# Patient Record
Sex: Male | Born: 1975 | Race: White | Hispanic: No | Marital: Married | Smoking: Former smoker
Health system: Southern US, Community
[De-identification: ages and names within clinical notes are randomized; demographics above are authoritative.]

## PROBLEM LIST (undated history)

## (undated) DIAGNOSIS — I471 Supraventricular tachycardia, unspecified: Secondary | ICD-10-CM

## (undated) DIAGNOSIS — J069 Acute upper respiratory infection, unspecified: Secondary | ICD-10-CM

## (undated) DIAGNOSIS — G4730 Sleep apnea, unspecified: Secondary | ICD-10-CM

## (undated) DIAGNOSIS — Z9852 Vasectomy status: Secondary | ICD-10-CM

## (undated) HISTORY — PX: COLONOSCOPY: SHX174

## (undated) HISTORY — DX: Acute upper respiratory infection, unspecified: J06.9

## (undated) HISTORY — DX: Supraventricular tachycardia, unspecified: I47.10

## (undated) HISTORY — DX: Supraventricular tachycardia: I47.1

## (undated) HISTORY — DX: Vasectomy status: Z98.52

## (undated) HISTORY — PX: CARDIAC CATHETERIZATION: SHX172

---

## 2014-06-05 LAB — BASIC METABOLIC PANEL
BUN: 13 mg/dL (ref 4–21)
CREATININE: 1 mg/dL (ref 0.6–1.3)
Glucose: 89 mg/dL

## 2014-06-05 LAB — CBC AND DIFFERENTIAL
HCT: 44 % (ref 41–53)
Hemoglobin: 15.1 g/dL (ref 13.5–17.5)
WBC: 6.4 10^3/mL

## 2014-06-05 LAB — LIPID PANEL
CHOLESTEROL: 175 mg/dL (ref 0–200)
HDL: 63 mg/dL (ref 35–70)
LDL CALC: 90 mg/dL
TRIGLYCERIDES: 112 mg/dL (ref 40–160)

## 2016-05-20 ENCOUNTER — Ambulatory Visit: Payer: Self-pay | Admitting: Osteopathic Medicine

## 2016-05-31 ENCOUNTER — Encounter: Payer: Self-pay | Admitting: Osteopathic Medicine

## 2016-05-31 ENCOUNTER — Ambulatory Visit (INDEPENDENT_AMBULATORY_CARE_PROVIDER_SITE_OTHER): Payer: 59 | Admitting: Osteopathic Medicine

## 2016-05-31 VITALS — BP 120/66 | HR 49 | Ht 70.0 in | Wt 292.0 lb

## 2016-05-31 DIAGNOSIS — Z8679 Personal history of other diseases of the circulatory system: Secondary | ICD-10-CM | POA: Insufficient documentation

## 2016-05-31 DIAGNOSIS — E291 Testicular hypofunction: Secondary | ICD-10-CM

## 2016-05-31 DIAGNOSIS — R5383 Other fatigue: Secondary | ICD-10-CM | POA: Diagnosis not present

## 2016-05-31 DIAGNOSIS — Z23 Encounter for immunization: Secondary | ICD-10-CM | POA: Diagnosis not present

## 2016-05-31 DIAGNOSIS — R001 Bradycardia, unspecified: Secondary | ICD-10-CM | POA: Diagnosis not present

## 2016-05-31 DIAGNOSIS — R635 Abnormal weight gain: Secondary | ICD-10-CM

## 2016-05-31 DIAGNOSIS — R6882 Decreased libido: Secondary | ICD-10-CM | POA: Insufficient documentation

## 2016-05-31 DIAGNOSIS — R7989 Other specified abnormal findings of blood chemistry: Secondary | ICD-10-CM

## 2016-05-31 NOTE — Patient Instructions (Signed)
Things to remember for exercise for weight loss: Gradually increasing the intensity, frequency, or duration of your workout. Increased interval training and muscle strengthening exercises will help overall fitness and weight loss as you build muscle mass and increase cardiovascular fitness. DO NOT use exercise as a pass to eat that Snickers! Weight is lost in the kitchen, the gym is just a bonus, which brings us to...  Things to remember for diet changes for weight loss: Calorie restriction with the goal weight loss of no more than one to one and a half pounds per week. Increase lean protein such as chicken, fish, [place]. Decrease fatty foods such as dairy, butter. Decrease sugary foods. Increase fiber found in fruits and vegetables and whole gtains. Avoid sugary drinks such as soda or juice. Eat small portions frequently rather than "three square meals per day." If you'd like a referral to a dietician to come up with a meal plan, I'm happy to order this for you - just let me know.   I encourage logging diet/exercise - this keeps you accountable #1 because I make you bring it to your next visit before prescribing medications ;) and #2 because it gives you an idea of your progress and tells us what you're doing well versus where you can improve. There are many apps available for your smartphone, but a notebook and a pen work just as well.   Medications approved for long-term use for obesity  Qsymia (Phentermine and Topiramate) * would avoid this one for you   Saxenda (Liraglutide 3 mg/day)  Contrave (Bupropion and Naltrexone)  Lorcaserin (Belviq or Belviq XR)  Orlistat (Xenical, Alli)  Bupropion (Wellbutrin) I recommend that you research the above medications to see what one might be a good fit for you, and then see which one(s) your insurance may or may not cover: If you call your insurance, ask them specifically "what medications are on my formulary for obesity treatment." They should be able to  send you a list or tell you over the phone. You can also ask if savings cards are an option for you.

## 2016-05-31 NOTE — Progress Notes (Signed)
HPI: Kyle Duncan is a 40 y.o. male  who presents to [facility] today, 05/31/16,  for chief complaint of:  Chief Complaint  Patient presents with  . Establish Care    New patient here to establish care. He has some concerns about weight gain, low libido, lack of energy over the past several months. Otherwise, no complaints.  Reports a history of paroxysmal supraventricular tachycardia, has never undergone ablation, was told that it was riskier than it was worse. Has a history of bradycardia, previously in the [employer]. No previous cardiac workup records available for review.    Past medical, surgical, social and family history reviewed: Past Medical History:  Diagnosis Date  . PSVT (paroxysmal supraventricular tachycardia) ([place])    History reviewed. No pertinent surgical history. Social History  Substance Use Topics  . Smoking status: Never Smoker  . Smokeless tobacco: Never Used  . Alcohol use Not on file   Family History  Problem Relation Age of Onset  . Cancer Father   . Diabetes Maternal Grandmother   . Hyperlipidemia Maternal Grandmother   . Heart attack Paternal Grandmother   . Stroke Paternal Grandfather      Current medication list and allergy/intolerance information reviewed:   No current outpatient prescriptions on file.   No current facility-administered medications for this visit.    No Known Allergies    Review of Systems:  Constitutional:  No  fever, no chills, No recent illness, No unintentional weight changes. No significant fatigue.   HEENT: No  headache, no vision change  Cardiac: No  chest pain, No  pressure, No palpitations, No  Orthopnea  Respiratory:  No  shortness of breath. No  Cough  Gastrointestinal: No  abdominal pain, No  nausea  Musculoskeletal: No new myalgia/arthralgia  Skin: No  Rash, No other wounds/concerning lesions  Hem/Onc: No  easy bruising/bleeding  Endocrine: No cold  intolerance,  No heat intolerance  Neurologic: No  weakness, No  dizziness, No  slurred speech/focal weakness/facial droop  Psychiatric: No  concerns with depression, No  concerns with anxiety, No sleep problems, No mood problems  Exam:  BP 120/66   Pulse (!) 49   Ht 5' 10"  (1.778 m)   Wt 292 lb (132.5 kg)   BMI 41.90 kg/m   Constitutional: VS see above. General Appearance: alert, well-developed, well-nourished, NAD  Eyes: Normal lids and conjunctive, non-icteric sclera  Ears, Nose, Mouth, Throat: MMM, Normal external inspection ears/nares/mouth/lips/gums.   Neck: No masses, trachea midline. No thyroid enlargement. No tenderness/mass appreciated. No lymphadenopathy  Respiratory: Normal respiratory effort. no wheeze, no rhonchi, no rales  Cardiovascular: S1/S2 normal, no murmur, no rub/gallop auscultated. Regular rhythm, rate 40s to 50s. No lower extremity edema.   Musculoskeletal: Gait normal. No clubbing/cyanosis of digits.   Neurological: Normal balance/coordination. No tremor.   Skin: warm, dry, intact. No rash/ulcer.   Psychiatric: Normal judgment/insight. Normal mood and affect. Oriented x3.    Results for orders placed or performed in visit on 05/31/16 (from the past 72 hour(s))  CBC with Differential/Platelet     Status: None   Collection Time: 05/31/16  8:20 AM  Result Value Ref Range   WBC 5.1 3.8 - 10.8 K/uL   RBC 4.44 4.20 - 5.80 MIL/uL   Hemoglobin 14.1 13.2 - 17.1 g/dL   HCT 41.1 38.5 - 50.0 %   MCV 92.6 80.0 - 100.0 fL   MCH 31.8 27.0 - 33.0 pg   MCHC 34.3 32.0 - 36.0  g/dL   RDW 13.0 11.0 - 15.0 %   Platelets 277 140 - 400 K/uL   MPV 10.1 7.5 - 12.5 fL   Neutro Abs 2,244 1,500 - 7,800 cells/uL   Lymphs Abs 2,142 850 - 3,900 cells/uL   Monocytes Absolute 510 200 - 950 cells/uL   Eosinophils Absolute 153 15 - 500 cells/uL   Basophils Absolute 51 0 - 200 cells/uL   Neutrophils Relative % 44 %   Lymphocytes Relative 42 %   Monocytes Relative 10 %    Eosinophils Relative 3 %   Basophils Relative 1 %   Smear Review Criteria for review not met   COMPLETE METABOLIC PANEL WITH GFR     Status: None   Collection Time: 05/31/16  8:20 AM  Result Value Ref Range   Sodium 138 135 - 146 mmol/L   Potassium 4.4 3.5 - 5.3 mmol/L   Chloride 102 98 - 110 mmol/L   CO2 29 20 - 31 mmol/L   Glucose, Bld 91 65 - 99 mg/dL   BUN 11 7 - 25 mg/dL   Creat 0.94 0.60 - 1.35 mg/dL   Total Bilirubin 0.8 0.2 - 1.2 mg/dL   Alkaline Phosphatase 51 40 - 115 U/L   AST 24 10 - 40 U/L   ALT 32 9 - 46 U/L   Total Protein 6.7 6.1 - 8.1 g/dL   Albumin 4.4 3.6 - 5.1 g/dL   Calcium 9.5 8.6 - 10.3 mg/dL   GFR, Est African American >89 >=60 mL/min   GFR, Est Non African American >89 >=60 mL/min  Lipid panel     Status: None   Collection Time: 05/31/16  8:20 AM  Result Value Ref Range   Cholesterol 183 125 - 200 mg/dL   Triglycerides 138 <150 mg/dL   HDL 66 >=40 mg/dL   Total CHOL/HDL Ratio 2.8 <=5.0 Ratio   VLDL 28 <30 mg/dL   LDL Cholesterol 89 <130 mg/dL    Comment:   Total Cholesterol/HDL Ratio:CHD Risk                        Coronary Heart Disease Risk Table                                        Men       Women          1/2 Average Risk              3.4        3.3              Average Risk              5.0        4.4           2X Average Risk              9.6        7.1           3X Average Risk             23.4       11.0 Use the calculated Patient Ratio above and the CHD Risk table  to determine the patient's CHD Risk.   TSH     Status: None   Collection Time: 05/31/16  8:20 AM  Result Value Ref Range  TSH 1.78 0.40 - 4.50 mIU/L  VITAMIN D 25 Hydroxy (Vit-D Deficiency, Fractures)     Status: None   Collection Time: 05/31/16  8:20 AM  Result Value Ref Range   Vit D, 25-Hydroxy 30 30 - 100 ng/mL    Comment: Vitamin D Status           25-OH Vitamin D        Deficiency                <20 ng/mL        Insufficiency         20 - 29 ng/mL         Optimal             > or = 30 ng/mL   For 25-OH Vitamin D testing on patients on D2-supplementation and patients for whom quantitation of D2 and D3 fractions is required, the [facility] 25-OH VIT D, (D2,D3), LC/MS/MS is recommended: order code [id number] (patients > 2 yrs).   Testosterone Total,Free,Bio, Males     Status: Abnormal   Collection Time: 05/31/16  8:20 AM  Result Value Ref Range   Testosterone 294 250 - 827 ng/dL    Comment: Men with clinically significant hypogonadal symptoms and testosterone values repeatedly less than approximately 300 ng/dL may benefit from testosterone treatment after adequate risk and benefits counseling.    Albumin 4.4 3.6 - 5.1 g/dL   Sex Hormone Binding 22 10 - 50 nmol/L   Testosterone, Free 51.1 46.0 - 224.0 pg/mL    Comment: ** Please note change in reference range(s). **      Testosterone, Bioavailable 102.9 (L) 110.0 - 575.0 ng/dL    Comment: ** Please note change in reference range(s). **       EKG interpretation: Rate: 47 bpm Rhythm: sinus No ST/T changes concerning for acute ischemia/infarct    ASSESSMENT/PLAN:   Weight gain - Extensive discussion about lifestyle changes, briefly discussed medication options to consider - Plan: CBC with Differential/Platelet, COMPLETE METABOLIC PANEL WITH GFR, Lipid panel, TSH  Need for prophylactic vaccination and inoculation against influenza - Plan: Flu Vaccine QUAD 36+ mos IM  Decreased libido - Plan: TSH, Testosterone Total,Free,Bio, Males  Fatigue, unspecified type - Plan: VITAMIN D 25 Hydroxy (Vit-D Deficiency, Fractures), Testosterone Total,Free,Bio, Males  Bradycardia - Denies dizziness or weakness, this is a long-standing problem for the patient. Await previous records - Plan: CBC with Differential/Platelet, COMPLETE METABOLIC PANEL WITH GFR, TSH  History of PSVT (paroxysmal supraventricular tachycardia) - Denies palpitations - Plan: CBC with Differential/Platelet, COMPLETE METABOLIC  PANEL WITH GFR, TSH  Decreased testosterone level - Plan: Testosterone Total,Free,Bio, Males, FSH/LH, PSA    Patient Instructions  Things to remember for exercise for weight loss: Gradually increasing the intensity, frequency, or duration of your workout. Increased interval training and muscle strengthening exercises will help overall fitness and weight loss as you build muscle mass and increase cardiovascular fitness. DO NOT use exercise as a pass to eat that Snickers! Weight is lost in the kitchen, the gym is just a bonus, which brings [place] to...  Things to remember for diet changes for weight loss: Calorie restriction with the goal weight loss of no more than one to one and a half pounds per week. Increase lean protein such as chicken, fish, [place]. Decrease fatty foods such as dairy, butter. Decrease sugary foods. Increase fiber found in fruits and vegetables and whole gtains. Avoid sugary drinks such as soda or juice. Eat small portions  frequently rather than "three square meals per day." If you'd like a referral to a dietician to come up with a meal plan, I'm happy to order this for you - just let me know.   I encourage logging diet/exercise - this keeps you accountable #1 because I make you bring it to your next visit before prescribing medications ;) and #2 because it gives you an idea of your progress and tells [place] what you're doing well versus where you can improve. There are many apps available for your smartphone, but a notebook and a pen work just as well.   Medications approved for long-term use for obesity  Qsymia (Phentermine and Topiramate) * would avoid this one for you   Saxenda (Liraglutide 3 mg/day)  Contrave (Bupropion and Naltrexone)  Lorcaserin (Belviq or Belviq XR)  Orlistat (Xenical, Alli)  Bupropion (Wellbutrin) I recommend that you research the above medications to see what one might be a good fit for you, and then see which one(s) your insurance may or may not  cover: If you call your insurance, ask them specifically "what medications are on my formulary for obesity treatment." They should be able to send you a list or tell you over the phone. You can also ask if savings cards are an option for you.     Visit summary with medication list and pertinent instructions was printed for patient to review. All questions at time of visit were answered - patient instructed to contact office with any additional concerns. ER/RTC precautions were reviewed with the patient. Follow-up plan: Return for labs 8:00 am (or as close to that as you can, no later than 10:00) .

## 2016-06-01 LAB — CBC WITH DIFFERENTIAL/PLATELET
BASOS ABS: 51 {cells}/uL (ref 0–200)
Basophils Relative: 1 %
EOS ABS: 153 {cells}/uL (ref 15–500)
EOS PCT: 3 %
HCT: 41.1 % (ref 38.5–50.0)
HEMOGLOBIN: 14.1 g/dL (ref 13.2–17.1)
LYMPHS ABS: 2142 {cells}/uL (ref 850–3900)
Lymphocytes Relative: 42 %
MCH: 31.8 pg (ref 27.0–33.0)
MCHC: 34.3 g/dL (ref 32.0–36.0)
MCV: 92.6 fL (ref 80.0–100.0)
MONO ABS: 510 {cells}/uL (ref 200–950)
MPV: 10.1 fL (ref 7.5–12.5)
Monocytes Relative: 10 %
NEUTROS ABS: 2244 {cells}/uL (ref 1500–7800)
Neutrophils Relative %: 44 %
Platelets: 277 10*3/uL (ref 140–400)
RBC: 4.44 MIL/uL (ref 4.20–5.80)
RDW: 13 % (ref 11.0–15.0)
WBC: 5.1 10*3/uL (ref 3.8–10.8)

## 2016-06-01 LAB — COMPLETE METABOLIC PANEL WITH GFR
ALBUMIN: 4.4 g/dL (ref 3.6–5.1)
ALK PHOS: 51 U/L (ref 40–115)
ALT: 32 U/L (ref 9–46)
AST: 24 U/L (ref 10–40)
BILIRUBIN TOTAL: 0.8 mg/dL (ref 0.2–1.2)
BUN: 11 mg/dL (ref 7–25)
CO2: 29 mmol/L (ref 20–31)
Calcium: 9.5 mg/dL (ref 8.6–10.3)
Chloride: 102 mmol/L (ref 98–110)
Creat: 0.94 mg/dL (ref 0.60–1.35)
GFR, Est African American: >89 mL/min (ref >=60)
GLUCOSE: 91 mg/dL (ref 65–99)
Potassium: 4.4 mmol/L (ref 3.5–5.3)
SODIUM: 138 mmol/L (ref 135–146)
TOTAL PROTEIN: 6.7 g/dL (ref 6.1–8.1)

## 2016-06-01 LAB — TSH: TSH: 1.78 m[IU]/L (ref 0.40–4.50)

## 2016-06-01 LAB — LIPID PANEL
Cholesterol: 183 mg/dL (ref 125–200)
HDL: 66 mg/dL (ref >=40)
LDL Cholesterol: 89 mg/dL (ref <130)
Total CHOL/HDL Ratio: 2.8 Ratio (ref <=5.0)
Triglycerides: 138 mg/dL (ref <150)
VLDL: 28 mg/dL (ref <30)

## 2016-06-02 LAB — TESTOSTERONE TOTAL,FREE,BIO, MALES
ALBUMIN: 4.4 g/dL (ref 3.6–5.1)
SEX HORMONE BINDING: 22 nmol/L (ref 10–50)
TESTOSTERONE: 294 ng/dL (ref 250–827)
Testosterone, Bioavailable: 102.9 ng/dL — ABNORMAL LOW (ref 110.0–575.0)
Testosterone, Free: 51.1 pg/mL (ref 46.0–224.0)

## 2016-06-02 LAB — VITAMIN D 25 HYDROXY (VIT D DEFICIENCY, FRACTURES): VIT D 25 HYDROXY: 30 ng/mL (ref 30–100)

## 2016-06-03 DIAGNOSIS — R7989 Other specified abnormal findings of blood chemistry: Secondary | ICD-10-CM | POA: Insufficient documentation

## 2016-06-09 ENCOUNTER — Encounter: Payer: Self-pay | Admitting: Osteopathic Medicine

## 2016-06-09 DIAGNOSIS — Z9852 Vasectomy status: Secondary | ICD-10-CM

## 2016-06-09 HISTORY — DX: Vasectomy status: Z98.52

## 2016-06-10 NOTE — Addendum Note (Signed)
Addended by: Avon GullyMCCRIMMON, Shalamar Crays C on: 06/10/2016 02:15 PM   Modules accepted: Orders

## 2016-06-24 ENCOUNTER — Encounter: Payer: Self-pay | Admitting: Osteopathic Medicine

## 2016-07-01 ENCOUNTER — Other Ambulatory Visit: Payer: Self-pay

## 2016-07-01 DIAGNOSIS — R7989 Other specified abnormal findings of blood chemistry: Secondary | ICD-10-CM

## 2016-07-03 LAB — PSA: PSA: 0.2 ng/mL (ref <=4.0)

## 2016-07-03 LAB — FSH/LH
FSH: 5.6 m[IU]/mL (ref 1.6–8.0)
LH: 5 m[IU]/mL (ref 1.5–9.3)

## 2016-07-05 LAB — TESTOSTERONE TOTAL,FREE,BIO, MALES
ALBUMIN: 4.7 g/dL (ref 3.6–5.1)
SEX HORMONE BINDING: 21 nmol/L (ref 10–50)
TESTOSTERONE: 252 ng/dL (ref 250–827)
Testosterone, Bioavailable: 93.2 ng/dL — ABNORMAL LOW (ref 110.0–575.0)
Testosterone, Free: 43.5 pg/mL — ABNORMAL LOW (ref 46.0–224.0)

## 2016-09-02 ENCOUNTER — Encounter: Payer: Self-pay | Admitting: Osteopathic Medicine

## 2016-09-02 ENCOUNTER — Ambulatory Visit (INDEPENDENT_AMBULATORY_CARE_PROVIDER_SITE_OTHER): Payer: 59 | Admitting: Osteopathic Medicine

## 2016-09-02 VITALS — BP 132/71 | HR 53 | Wt 298.0 lb

## 2016-09-02 DIAGNOSIS — R6882 Decreased libido: Secondary | ICD-10-CM

## 2016-09-02 DIAGNOSIS — E291 Testicular hypofunction: Secondary | ICD-10-CM | POA: Diagnosis not present

## 2016-09-02 MED ORDER — TESTOSTERONE 50 MG/5GM (1%) TD GEL: 5.0000 g | Freq: Every day | TRANSDERMAL | 3 refills | Status: DC

## 2016-09-02 NOTE — Patient Instructions (Addendum)
Diagnosis code: E29.1 = Male Hypogonadism   Options I routinely prescribe include:  #1 testosterone shots every 2 weeks (typically is least expensive, can be administered at home or in our office, we have to have you follow up here for a nurse visit at first to document that you have been educated on the shots and know how to administer the injections at home) or  #2/3 testosterone patches/gels (more expensive but many insurances cover these medicines)     You can ask your insurance company what is on their formulary for testosterone replacement medications if you have a preference.   There is some evidence of testosterone increasing risk for heart disease, sleep apnea, and blood clots - so routine lab monitoring and BP checks are needed while on T replacement.   Let me know what you think about shots vs gels vs patches! Let me know if cost/insurance plays any role here! Once I hear back from you, I can write the appropriate prescription.  Take care! -Dr. Mervyn SkeetersA.

## 2016-09-02 NOTE — Progress Notes (Signed)
HPI: Kyle Duncan is a 41 y.o. male  who presents to [facility] today, 09/02/16,  for chief complaint of:  Chief Complaint  Patient presents with  . discuss testosterone replacement therapy    Testosterone less than 300 and free testosterone levels low on 2 separate occasions, patient is experiencing significant fatigue and low libido and would like to discuss starting replacement therapy.  Discussion on the following: Testosterone adverse effects: Possibility of increased risk of prostate problems, sleep apnea, erythrocytosis, venous thrombi embolism, increased cardiovascular risk. Medication options: Include gel, patch, injection. Patient would like to avoid needles if possible Monitoring Recheck T 2-3 mos after tx initiated, q56mos at stable dose Routine CBC and BP checks  Patient verbalizes understanding of risks and agrees to routine monitoring      Past medical, surgical, social and family history reviewed: Patient Active Problem List   Diagnosis Date Noted  . H/O: vasectomy 06/09/2016  . 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 History of PSVT (paroxysmal supraventricular tachycardia) 05/31/2016  . Bradycardia 05/31/2016  . Fatigue 05/31/2016  . Decreased libido 05/31/2016  . Weight gain 05/31/2016   No past surgical history on file. Social History  Substance Use Topics  . Smoking status: Never Smoker  . Smokeless tobacco: Never Used  . Alcohol use Not on file   Family History  Problem Relation Age of Onset  . Cancer Father   . Diabetes Maternal Grandmother   . Hyperlipidemia Maternal Grandmother   . Heart attack Paternal Grandmother   . Stroke Paternal Grandfather      Current medication list and allergy/intolerance information reviewed:   No current outpatient prescriptions on file prior to visit.   No current facility-administered medications on file prior to visit.    No Known Allergies    Review of  Systems:  Constitutional: No recent illness  HEENT: No  headache, no vision change  Cardiac: No  chest pain, No  pressure, No palpitations  Reproductive/genitourinary: Patient reports diminished libido, erectile dysfunction issues  Psychiatric: Patient reports decreased energy, concerns for depression/lack of motivation  Exam:  BP 132/71   Pulse (!) 53   Wt 298 lb (135.2 kg)   BMI 42.76 kg/m   Constitutional: VS see above. General Appearance: alert, well-developed, well-nourished, NAD  Eyes: Normal lids and conjunctive, non-icteric sclera  Ears, Nose, Mouth, Throat: MMM, Normal external inspection ears/nares/mouth/lips/gums.  Neck: No masses, trachea midline.   Respiratory: Normal respiratory effort. no wheeze, no rhonchi, no rales  Cardiovascular: S1/S2 normal, no murmur, no rub/gallop auscultated. RRR.   Musculoskeletal: Gait normal. Symmetric and independent movement of all extremities  Neurological: Normal balance/coordination. No tremor.  Skin: warm, dry, intact.   Psychiatric: Normal judgment/insight. Normal mood and affect. Oriented x3.      ASSESSMENT/PLAN:   Hypogonadism in male - Plan: testosterone (ANDROGEL) 50 MG/5GM (1%) GEL  Decreased libido    Patient Instructions  Diagnosis code: E29.1 = Male Hypogonadism   Options I routinely prescribe include:  #1 testosterone shots every 2 weeks (typically is least expensive, can be administered at home or in our office, we have to have you follow up here for a nurse visit at first to document that you have been educated on the shots and know how to administer the injections at home) or  #2/3 testosterone patches/gels (more expensive but many insurances cover these medicines)     You can ask your insurance company what is on their formulary for testosterone replacement medications if  you have a preference.   There is some evidence of testosterone increasing risk for heart disease, sleep apnea, and blood  clots - so routine lab monitoring and BP checks are needed while on T replacement.   Let me know what you think about shots vs gels vs patches! Let me know if cost/insurance plays any role here! Once I hear back from you, I can write the appropriate prescription.  Take care! -Dr. Mervyn SkeetersA.      Visit summary with medication list and pertinent instructions was printed for patient to review. All questions at time of visit were answered - patient instructed to contact office with any additional concerns. ER/RTC precautions were reviewed with the patient. Follow-up plan: Return in about 2 months (around 10/31/2016) for LAB VISIT - RECHECK T LEVELS ABD BLOOD COUNT .  Note: Total time spent 25 minutes, greater than 50% of the visit was spent face-to-face counseling and coordinating care for the following: The primary encounter diagnosis was Hypogonadism in male. A diagnosis of Decreased libido was also pertinent to this visit..Marland Kitchen

## 2017-03-03 ENCOUNTER — Ambulatory Visit: Payer: 59 | Admitting: Osteopathic Medicine

## 2017-03-03 DIAGNOSIS — Z0189 Encounter for other specified special examinations: Secondary | ICD-10-CM

## 2017-03-10 ENCOUNTER — Ambulatory Visit (INDEPENDENT_AMBULATORY_CARE_PROVIDER_SITE_OTHER): Payer: 59 | Admitting: Osteopathic Medicine

## 2017-03-10 ENCOUNTER — Encounter: Payer: Self-pay | Admitting: Osteopathic Medicine

## 2017-03-10 VITALS — BP 120/74 | HR 63 | Ht 70.0 in | Wt 301.0 lb

## 2017-03-10 DIAGNOSIS — K219 Gastro-esophageal reflux disease without esophagitis: Secondary | ICD-10-CM

## 2017-03-10 DIAGNOSIS — Z23 Encounter for immunization: Secondary | ICD-10-CM

## 2017-03-10 DIAGNOSIS — Z6841 Body Mass Index (BMI) 40.0 and over, adult: Secondary | ICD-10-CM | POA: Diagnosis not present

## 2017-03-10 DIAGNOSIS — M7989 Other specified soft tissue disorders: Secondary | ICD-10-CM

## 2017-03-10 LAB — CBC
HCT: 43.2 % (ref 38.5–50.0)
Hemoglobin: 14.6 g/dL (ref 13.2–17.1)
MCH: 31.4 pg (ref 27.0–33.0)
MCHC: 33.8 g/dL (ref 32.0–36.0)
MCV: 92.9 fL (ref 80.0–100.0)
MPV: 10.1 fL (ref 7.5–12.5)
Platelets: 286 10*3/uL (ref 140–400)
RBC: 4.65 MIL/uL (ref 4.20–5.80)
RDW: 13.3 % (ref 11.0–15.0)
WBC: 6 10*3/uL (ref 3.8–10.8)

## 2017-03-10 LAB — TSH: TSH: 2.88 mIU/L (ref 0.40–4.50)

## 2017-03-10 MED ORDER — OMEPRAZOLE 40 MG PO CPDR: 40.0000 mg | DELAYED_RELEASE_CAPSULE | Freq: Every day | ORAL | 0 refills | Status: DC

## 2017-03-10 MED ORDER — RANITIDINE HCL 300 MG PO TABS: 300.0000 mg | ORAL_TABLET | Freq: Every day | ORAL | 3 refills | Status: DC

## 2017-03-10 NOTE — Progress Notes (Signed)
HPI: Quincy SimmondsClifford Donahoe is a 41 y.o. male  who [facility] [place] [place], 03/10/17,  for chief complaint of:  Chief Complaint  Patient presents with  . Leg Swelling  . Other    having heartburn    Heartburn . Location & Quality: Burning in epigastrium and middle of chest . Duration: Many months, probably up to a year . Timing: worse with food, can be bad with a see things or with dairy . Modifying factors: Has been using a lot of Tums, no OTC daily pill such as Prilosec or Zantac . Assoc signs/symptoms: No pain with eating, no abdominal pain  Lower extremity swelling . Location: Bilateral lower extremities . Quality: Socks are leaving indentations, if he presses down really hard there will sometimes be a divot in the skin . Context: Given history of PSVT, he is concerned about heart failure.Marland Kitchen. He also has some questions about whether being overweight and having low testosterone can be contributing to this . Assoc signs/symptoms: No chest pain, shortness of breath, orthopnea   Past medical history, surgical history, social history and family history reviewed.  Patient Active Problem List   Diagnosis Date Noted  . H/O: vasectomy 06/09/2016  . 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 History of PSVT (paroxysmal supraventricular tachycardia) 05/31/2016  . Bradycardia 05/31/2016  . Fatigue 05/31/2016  . Decreased libido 05/31/2016  . Weight gain 05/31/2016    Current medication list and allergy/intolerance information reviewed.   No current outpatient prescriptions on file prior to visit.   No current facility-administered medications on file prior to visit.    Not on File    Review of Systems:  Constitutional: No recent illness  HEENT: No  headache, no vision change  Cardiac: No  chest pain, No  pressure, No palpitations, +lLE sweling as per HPI  Respiratory:  No  shortness of breath. No  Cough  Gastrointestinal: No  abdominal  pain, no change on bowel habits, _heartburn as per HPI  Musculoskeletal: No new myalgia/arthralgia  Skin: No  Rash  Neurologic: No  weakness, No  Dizziness   Exam:  BP 120/74   Pulse 63   Ht 5\' 10"  (1.778 m)   Wt (!) 301 lb (136.5 kg)   BMI 43.19 kg/m   Constitutional: VS see above. General Appearance: alert, well-developed, well-nourished, NAD  Eyes: Normal lids and conjunctive, non-icteric sclera  Ears, Nose, Mouth, Throat: MMM, Normal external inspection ears/nares/mouth/lips/gums.  Neck: No masses, trachea midline.   Respiratory: Normal respiratory effort. no wheeze, no rhonchi, no rales  Cardiovascular: S1/S2 normal, no murmur, no rub/gallop auscultated. RRR. Trace LE edema  Musculoskeletal: Gait normal. Symmetric and independent movement of all extremities  Neurological: Normal balance/coordination. No tremor.  Skin: warm, dry, intact.   Psychiatric: Normal judgment/insight. Normal mood and affect. Oriented x3.      ASSESSMENT/PLAN:   Gastroesophageal reflux disease, esophagitis presence not specified - High-dose PPI for 8-12 weeks then transition to H2 blocker, consider referral for EGD if worse/no better - Plan: omeprazole (PRILOSEC) 40 MG capsule, ranitidine (ZANTAC) 300 MG tablet  Swelling of lower extremity - Think more likely due to overweight, long hours standing/sitting at work, beginnings of vascular/lymphatic insufficiency due to age. No clinical concern for CHF - Plan: CBC, COMPLETE METABOLIC PANEL WITH GFR, TSH, Urinalysis, Routine w reflex microscopic  Need for tetanus, diphtheria, and acellular pertussis (Tdap) vaccine in patient of adolescent age or older - Plan: Tdap vaccine greater than or equal to 7yo  IM  BMI 40.0-44.9, adult [facility])    Patient Instructions  Plan: Labs today to make sure no concerning causes of swelling For heartburn: start prescription dose Prilosec/omeprazole x8-12 weeks then transition to Zantac/ranitidine - if heartburn  persists, or gets worse in the meantime, let me know because we may need to send to GI specialist for scope      Follow-up plan: Return if symptoms worsen or fail to improve.  Visit summary with medication list and pertinent instructions was printed for patient to review, alert [place] if any changes needed. All questions at time of visit were answered - patient instructed to contact office with any additional concerns. ER/RTC precautions were reviewed with the patient and understanding verbalized.

## 2017-03-10 NOTE — Patient Instructions (Signed)
Plan: Labs today to make sure no concerning causes of swelling For heartburn: start prescription dose Prilosec/omeprazole x8-12 weeks then transition to Zantac/ranitidine - if heartburn persists, or gets worse in the meantime, let me know because we may need to send to GI specialist for scope

## 2017-03-11 LAB — COMPLETE METABOLIC PANEL WITH GFR
ALT: 47 U/L — ABNORMAL HIGH (ref 9–46)
AST: 29 U/L (ref 10–40)
Albumin: 4.1 g/dL (ref 3.6–5.1)
Alkaline Phosphatase: 54 U/L (ref 40–115)
BILIRUBIN TOTAL: 0.5 mg/dL (ref 0.2–1.2)
BUN: 11 mg/dL (ref 7–25)
CHLORIDE: 104 mmol/L (ref 98–110)
CO2: 22 mmol/L (ref 20–31)
Calcium: 9.5 mg/dL (ref 8.6–10.3)
Creat: 0.96 mg/dL (ref 0.60–1.35)
GFR, Est African American: >89 mL/min (ref >=60)
GLUCOSE: 79 mg/dL (ref 65–99)
Potassium: 4.1 mmol/L (ref 3.5–5.3)
SODIUM: 141 mmol/L (ref 135–146)
TOTAL PROTEIN: 6.6 g/dL (ref 6.1–8.1)

## 2017-03-11 LAB — URINALYSIS, ROUTINE W REFLEX MICROSCOPIC
Bilirubin Urine: NEGATIVE
Glucose, UA: NEGATIVE
Hgb urine dipstick: NEGATIVE
KETONES UR: NEGATIVE
LEUKOCYTES UA: NEGATIVE
NITRITE: NEGATIVE
PH: 7.5 (ref 5.0–8.0)
Protein, ur: NEGATIVE
SPECIFIC GRAVITY, URINE: 1.017 (ref 1.001–1.035)

## 2017-04-09 ENCOUNTER — Emergency Department (INDEPENDENT_AMBULATORY_CARE_PROVIDER_SITE_OTHER)
Admission: EM | Admit: 2017-04-09 | Discharge: 2017-04-09 | Disposition: A | Discharge status: Home / Self Care | Payer: 59 | Source: Home / Self Care | Attending: Family Medicine | Admitting: Family Medicine

## 2017-04-09 DIAGNOSIS — L740 Miliaria rubra: Secondary | ICD-10-CM

## 2017-04-09 MED ORDER — PREDNISONE 20 MG PO TABS: ORAL_TABLET | ORAL | 0 refills | Status: DC

## 2017-04-09 MED ORDER — METHYLPREDNISOLONE SODIUM SUCC 125 MG IJ SOLR
80.0000 mg | Freq: Once | INTRAMUSCULAR | Status: AC
  Administered 2017-04-09: 80 mg via INTRAMUSCULAR

## 2017-04-09 NOTE — Discharge Instructions (Signed)
Begin prednisone on Sunday, 04/10/17. May take non-sedating antihistamine such as Zyrtec or Claritin for itching. Try applying anhydrous lanolin.

## 2017-04-09 NOTE — ED Provider Notes (Signed)
[facility]    CSN: [id number] Arrival date & time: 04/09/17  1256     History   Chief Complaint Chief Complaint  Patient presents with  . Rash    HPI Kyle Duncan is a 41 y.o. male.   Patient complains of a generalized pruritic rash for two days.  The rash spared his calves and dorsa of feet.  He believes that switching to a new laundry detergent caused the rash.  He feels well otherwise.  No fevers, chills, and sweats.  He states that he has been outdoors during the past several days.   The history is provided by the patient.  Rash  Location:  Full body Quality: dryness, itchiness and redness   Quality: not blistering, not bruising, not burning, not draining, not painful, not peeling, not scaling, not swelling and not weeping   Severity:  Moderate Onset quality:  Sudden Duration:  2 days Timing:  Constant Progression:  Worsening Chronicity:  New Context: new detergent/soap   Context: not animal contact, not chemical exposure, not eggs, not exposure to similar rash, not food, not hot tub use, not insect bite/sting, not medications, not nuts, not plant contact, not pollen, not sick contacts and not sun exposure   Relieved by:  Nothing Worsened by:  Heat Ineffective treatments:  Antihistamines Associated symptoms: no abdominal pain, no diarrhea, no fatigue, no fever, no headaches, no hoarse voice, no induration, no joint pain, no myalgias, no nausea, no periorbital edema, no shortness of breath, no sore throat, no throat swelling, no tongue swelling, no URI and not vomiting     Past Medical History:  Diagnosis Date  . H/O: vasectomy 06/09/2016   01/2013  . PSVT (paroxysmal supraventricular tachycardia) [facility])     Patient Active Problem List   Diagnosis Date Noted  . H/O: vasectomy 06/09/2016  . 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 History of PSVT (paroxysmal supraventricular tachycardia) 05/31/2016  . Bradycardia 05/31/2016  . Fatigue  05/31/2016  . Decreased libido 05/31/2016  . Weight gain 05/31/2016    No past surgical history on file.     Home Medications    Prior to Admission medications   Medication Sig Start Date End Date Taking? Authorizing Provider  omeprazole (PRILOSEC) 40 MG capsule Take 1 capsule (40 mg total) by mouth daily. 03/10/17   Sunnie Nielsen, DO  predniSONE (DELTASONE) 20 MG tablet Take one tab by mouth twice daily for 4 days, then one daily for 3 days. Take with food. 04/09/17   Lattie Haw, MD  ranitidine (ZANTAC) 300 MG tablet Take 1 tablet (300 mg total) by mouth at bedtime. 03/10/17   Sunnie Nielsen, DO    Family History Family History  Problem Relation Age of Onset  . Cancer Father   . Diabetes Maternal Grandmother   . Hyperlipidemia Maternal Grandmother   . Heart attack Paternal Grandmother   . Stroke Paternal Grandfather     Social History Social History  Substance Use Topics  . Smoking status: Never Smoker  . Smokeless tobacco: Never Used  . Alcohol use Not on file     Allergies   Patient has no known allergies.   Review of Systems Review of Systems  Constitutional: Negative for fatigue and fever.  HENT: Negative for hoarse voice and sore throat.   Respiratory: Negative for shortness of breath.   Gastrointestinal: Negative for abdominal pain, diarrhea, nausea and vomiting.  Musculoskeletal: Negative for arthralgias and myalgias.  Skin: Positive for rash.  Neurological: Negative  for headaches.  All other systems reviewed and are negative.    Physical Exam Triage Vital Signs ED Triage Vitals [04/09/17 1307]  Enc Vitals Group     BP 122/70     Pulse Rate 63     Resp      Temp 98.3 F (36.8 C)     Temp Source Oral     SpO2 98 %     Weight 284 lb (128.8 kg)     Height      Head Circumference      Peak Flow      Pain Score 0     Pain Loc      Pain Edu?      Excl. in GC?    No data found.   Updated Vital Signs BP 122/70 (BP Location:  Left Arm)   Pulse 63   Temp 98.3 F (36.8 C) (Oral)   Wt 284 lb (128.8 kg)   SpO2 98%   BMI 40.75 kg/m   Visual Acuity Right Eye Distance:   Left Eye Distance:   Bilateral Distance:    Right Eye Near:   Left Eye Near:    Bilateral Near:     Physical Exam  Constitutional: He appears well-developed and well-nourished. No distress.  HENT:  Head: Normocephalic.  Mouth/Throat: Oropharynx is clear and moist.  Eyes: Pupils are equal, round, and reactive to light. Conjunctivae are normal.  Neck: Normal range of motion.  Cardiovascular: Normal rate.   Pulmonary/Chest: Effort normal.  Musculoskeletal: He exhibits no edema.  Neurological: He is alert.  Skin: Skin is warm and dry.     Trunk, arms, legs, and abdomen have multiple 2 to 5 mm diameter slightly raised erythematous macules, not centered on follicles.  No tenderness to palpation.    Nursing note and vitals reviewed.    UC Treatments / Results  Labs (all labs ordered are listed, but only abnormal results are displayed) Labs Reviewed - No data to display  EKG  EKG Interpretation None       Radiology No results found.  Procedures Procedures (including critical care time)  Medications Ordered in UC Medications  methylPREDNISolone sodium succinate (SOLU-MEDROL) 125 mg/2 mL injection 80 mg (not administered)     Initial Impression / Assessment and Plan / UC Course  I have reviewed the triage vital signs and the nursing notes.  Pertinent labs & imaging results that were available during my care of the patient were reviewed by me and considered in my medical decision making (see chart for details).    Administered Solumedrol 80mg  IM Begin prednisone burst/taper on Sunday, 04/10/17. May take non-sedating antihistamine such as Zyrtec or Claritin for itching. Try applying anhydrous lanolin. Followup with dermatologist if not improving about 10 days.    Final Clinical Impressions(s) / UC Diagnoses   Final  diagnoses:  Miliaria rubra    New Prescriptions New Prescriptions   PREDNISONE (DELTASONE) 20 MG TABLET    Take one tab by mouth twice daily for 4 days, then one daily for 3 days. Take with food.        Lattie Haw, MD 04/09/17 [phone]

## 2017-04-09 NOTE — ED Triage Notes (Signed)
Patient c/o pruritic rash "all over". Changed laundry detergent 3 days ago.

## 2017-06-07 ENCOUNTER — Other Ambulatory Visit: Payer: Self-pay | Admitting: Osteopathic Medicine

## 2017-06-07 DIAGNOSIS — K219 Gastro-esophageal reflux disease without esophagitis: Secondary | ICD-10-CM

## 2017-08-20 ENCOUNTER — Emergency Department (INDEPENDENT_AMBULATORY_CARE_PROVIDER_SITE_OTHER)
Admission: EM | Admit: 2017-08-20 | Discharge: 2017-08-20 | Disposition: A | Discharge status: Home / Self Care | Payer: 59 | Source: Home / Self Care | Attending: Family Medicine | Admitting: Family Medicine

## 2017-08-20 ENCOUNTER — Encounter: Payer: Self-pay | Admitting: Emergency Medicine

## 2017-08-20 DIAGNOSIS — T23262A Burn of second degree of back of left hand, initial encounter: Secondary | ICD-10-CM

## 2017-08-20 MED ORDER — SILVER SULFADIAZINE 1 % EX CREA
1.0000 | TOPICAL_CREAM | Freq: Every day | CUTANEOUS | 0 refills | Status: DC
Start: 2017-08-20 — End: 2017-09-06

## 2017-08-20 NOTE — ED Provider Notes (Signed)
[facility]    CSN: [id number] Arrival date & time: 08/20/17  1437     History   Chief Complaint Chief Complaint  Patient presents with  . Hand Burn    HPI Kyle Duncan is a 41 y.o. male.   HPI  Kyle SimmondsClifford Arlington is a 41 y.o. male presenting to UC with c/o burn to Left hand that occurred last night at work.  Pt does not want to file worker's comp but did notify his boss last night.  He states he touched a hot object at work after another co-worker failed turn off a machine.  He has developed enlarging blisters to the pain of his hand, associated redness and burning pain.  Pain is 4/10 at this time.  He believes his last Tdap was this year or last year at the [facility].  No other injuries. He is Right hand dominant. No hx of DM.   Past Medical History:  Diagnosis Date  . H/O: vasectomy 06/09/2016   01/2013  . PSVT [facility]ox[facility]mal [facility]cardia) [facility])     Patient Active Problem List   Diagnosis Date Noted  . H/O: vasectomy 06/09/2016  . 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 History of PSVT (paroxysmal supraventricular tachycardia) 05/31/2016  . Bradycardia 05/31/2016  . Fatigue 05/31/2016  . Decreased libido 05/31/2016  . Weight gain 05/31/2016    History reviewed. No pertinent surgical history.     Home Medications    Prior to Admission medications   Medication Sig Start Date End Date Taking? Authorizing Provider  silver sulfADIAZINE (SILVADENE) 1 % cream Apply 1 application topically daily. Until burn has healed 08/20/17   Rolla PlatePhelps, Anirudh Baiz O, PA-C    Family History Family History  Problem Relation Age of Onset  . Cancer Father   . Diabetes Maternal Grandmother   . Hyperlipidemia Maternal Grandmother   . Heart attack Paternal Grandmother   . Stroke Paternal Grandfather     Social History Social History   Tobacco Use  . Smoking status: Never Smoker  . Smokeless tobacco: Never Used  Substance Use Topics  . Alcohol use: Not on  file  . Drug use: Not on file     Allergies   Patient has no known allergies.   Review of Systems Review of Systems  Musculoskeletal: Positive for arthralgias, joint swelling and myalgias.  Skin: Positive for color change and wound. Negative for rash.  Neurological: Negative for weakness and numbness.     Physical Exam Triage Vital Signs ED Triage Vitals  Enc Vitals Group     BP 08/20/17 1502 105/64     Pulse Rate 08/20/17 1502 (!) 55     Resp --      Temp 08/20/17 1502 99.1 F (37.3 C)     Temp Source 08/20/17 1502 Oral     SpO2 08/20/17 1502 99 %     Weight 08/20/17 1502 248 lb 4 oz (112.6 kg)     Height 08/20/17 1502 5\' 10"  (1.778 m)     Head Circumference --      Peak Flow --      Pain Score 08/20/17 1503 4     Pain Loc --      Pain Edu? --      Excl. in GC? --    No data found.  Updated Vital Signs BP 105/64 (BP Location: Right Arm)   Pulse (!) 55   Temp 99.1 F (37.3 C) (Oral)   Ht 5\' 10"  (1.778 m)  Wt 248 lb 4 oz (112.6 kg)   SpO2 99%   BMI 35.62 kg/m   Visual Acuity Right Eye Distance:   Left Eye Distance:   Bilateral Distance:    Right Eye Near:   Left Eye Near:    Bilateral Near:     Physical Exam  Constitutional: He is oriented to person, place, and time. He appears well-developed and well-nourished.  HENT:  Head: Normocephalic and atraumatic.  Eyes: EOM are normal.  Neck: Normal range of motion.  Cardiovascular: Normal rate.  Pulses:      Radial pulses are 2+ on the left side.  Pulmonary/Chest: Effort normal.  Musculoskeletal: He exhibits edema and tenderness.  Left hand: slight decreased flexion/unable to make a full fist due to pain and large bullae (see skin exam). Mild edema.  Neurological: He is alert and oriented to person, place, and time.  Skin: Skin is warm and dry. Capillary refill takes less than 2 seconds. Burn noted. There is erythema.  Left hand, palm aspect over 1st and 2nd metacarpals: 2 large bullae and 1 smaller  blister with surrounding erythema c/w partial thickness burn. Tender. No active bleeding or drainage.   Psychiatric: He has a normal mood and affect. His behavior is normal.  Nursing note and vitals reviewed.    UC Treatments / Results  Labs (all labs ordered are listed, but only abnormal results are displayed) Labs Reviewed - No data to display  EKG  EKG Interpretation None       Radiology No results found.  Procedures Incision and Drainage Date/Time: 08/20/2017 3:49 PM Performed by: Lurene ShadowPhelps, Jacob Cicero O, PA-C Authorized by: Lattie HawBeese, Stephen A, MD   Consent:    Consent obtained:  Verbal   Consent given by:  Patient   Risks discussed:  Bleeding, incomplete drainage, infection and pain   Alternatives discussed:  No treatment and delayed treatment Location:    Type:  Bulla   Size:  2cm   Location:  Upper extremity   Upper extremity location:  Hand   Hand location:  L hand Pre-procedure details:    Skin preparation:  Betadine Anesthesia (see MAR for exact dosages):    Anesthesia method:  None Procedure type:    Complexity:  Simple Procedure details:    Needle aspiration: no     Incision types:  Stab incision (with 18gtt needle)   Incision depth:  Subcutaneous   Techniques: gently massaged to express fluid.   Drainage:  Serous   Drainage amount:  Moderate   Wound treatment: Silvedene and bandage applied.   Packing materials:  None Post-procedure details:    Patient tolerance of procedure:  Tolerated well, no immediate complications   (including critical care time)  Medications Ordered in UC Medications - No data to display   Initial Impression / Assessment and Plan / UC Course  I have reviewed the triage vital signs and the nursing notes.  Pertinent labs & imaging results that were available during my care of the patient were reviewed by me and considered in my medical decision making (see chart for details).     Second degree burn to Left hand, palm side.  Non-dominant hand.  Bullae punctured with 18gtt needle due to size and location. Silveded and bandage applied  Home care instructions provided F/u with PCP or return to UC if needed in 4-5 days if not improving, sooner if worsening.   Final Clinical Impressions(s) / UC Diagnoses   Final diagnoses:  Partial thickness burn of back  of left hand, initial encounter    ED Discharge Orders        Ordered    silver sulfADIAZINE (SILVADENE) 1 % cream  Daily     08/20/17 1521       Controlled Substance Prescriptions [place] Controlled Substance Registry consulted? Not Applicable   Rolla Plate 08/20/17 1552

## 2017-08-20 NOTE — Discharge Instructions (Signed)
° °  You may take 500mg  acetaminophen every 4-6 hours or in combination with ibuprofen 400-600mg  every 6-8 hours as needed for pain and inflammation.   You may gently clean wound with warm water and mild sore. Pat dry. Apply silvadene and bandage to keep wound clean and protected.  Would should start healing over the next 1 week. Try to avoid peeling off blisters as they act as a natural bandage as the wound continues to heal underneath.

## 2017-08-20 NOTE — ED Triage Notes (Signed)
Patient burned left hand at work yesterday, touched an object that was hot, blisters on the inside of his left hand, some redness, unsure of last TD.

## 2017-08-24 ENCOUNTER — Telehealth: Payer: Self-pay

## 2017-08-24 NOTE — Telephone Encounter (Signed)
Pt feels that hand is healing well.  Will follow up as needed.

## 2017-08-30 ENCOUNTER — Encounter: Payer: 59 | Admitting: Family Medicine

## 2017-09-06 ENCOUNTER — Ambulatory Visit (INDEPENDENT_AMBULATORY_CARE_PROVIDER_SITE_OTHER): Payer: 59 | Admitting: Family Medicine

## 2017-09-06 ENCOUNTER — Encounter: Payer: Self-pay | Admitting: Family Medicine

## 2017-09-06 DIAGNOSIS — M7711 Lateral epicondylitis, right elbow: Secondary | ICD-10-CM | POA: Diagnosis not present

## 2017-09-06 DIAGNOSIS — G5623 Lesion of ulnar nerve, bilateral upper limbs: Secondary | ICD-10-CM | POA: Diagnosis not present

## 2017-09-06 MED ORDER — DICLOFENAC SODIUM 1 % TD GEL: 2.0000 g | Freq: Four times a day (QID) | TRANSDERMAL | 11 refills | Status: DC

## 2017-09-06 NOTE — Patient Instructions (Signed)
Thank you for coming in today. Apply the gel up to 4x daily.  Use a wrist brace as needed for elbow pain with activity.  Work on stretch and the eccentric wrist activity using resistance.  Remember to go slowly For the numbness try to keep the elbow from full flexion at night.  Use a towel or a reversed elbow pad.  Recheck with me in 6 weeks.  If not getting better let me know and I will order hand PT.    Cubital Tunnel Syndrome Cubital tunnel syndrome is a condition that causes pain and weakness of the forearm and hand. This condition happens when one of the nerves (ulnar nerve) that runs alongside the elbow joint becomes irritated. What are the causes? Causes of this condition include:  Increased pressure on the ulnar nerve at the elbow, arm, or forearm. This can be caused by: ? Swollen tissues. ? Ligaments. ? Muscles. ? Poorly healed elbow fractures. ? Tumors in the elbow. These are usually noncancerous (benign). ? Scar tissue that develops in the elbow after an injury. ? Bony growths (spurs) near the ulnar nerve.  Stretching of the nerve due to loose elbow ligaments.  Trauma to the nerve at the elbow.  Repetitive elbow bending.  Certain medical conditions.  What increases the risk? This condition is more likely to develop in:  People who do manual labor that requires frequently bending the elbow.  People who play sports that include repeated or strenuous throwing motions, such as baseball.  People who play contact sports, such as football or lacrosse.  People who do not warm up properly before activities.  People who have diabetes.  People who have an underactive thyroid (hypothyroidism).  What are the signs or symptoms? Symptoms of this condition include:  Clumsiness or weakness of the hand.  Tenderness of the inner elbow.  Aching or soreness of the inner elbow, forearm, or fingers, especially the little finger or the ring finger.  Increased pain with  forced elbow bending.  Reduced control when throwing.  Tingling, numbness, or burning inside the forearm, or in part of the hand or fingers, especially the little finger or the ring finger.  Sharp pains that shoot from the elbow down to the wrist and hand.  The inability to grip or pinch hard.  How is this diagnosed? This condition is diagnosed with a medical history and physical exam. Your health care provider will ask about your symptoms and ask for details about any injury. You may also have other tests, including:  Electromyogram (EMG). This test checks how well the nerve is working.  X-ray.  How is this treated? Treatment starts by stopping the activities that are causing your symptoms to get worse. Treatment may include the use of icing and medicines to reduce pain and swelling. You may also be advised to wear a splint to prevent your elbow from bending or wear an elbow pad where the ulnar nerve is closest to the skin. In less severe cases, treatment may also include working with a physical therapist:  To help decrease your symptoms.  To improve the strength and range of motion of your elbow, forearm, and hand.  If the treatments described above do not help, surgery may be needed. Follow these instructions at home: If you have a splint:  Wear it as told by your health care provider. Remove it only as told by your health care provider.  Loosen the splint if your fingers become numb and tingle, or if  they turn cold and blue.  Keep the splint clean and dry. Managing pain, stiffness, and swelling  If directed, apply ice to the injured area: ? Put ice in a plastic bag. ? Place a towel between your skin and the bag. ? Leave the ice on for 20 minutes, 2-3 times per day.  Move your fingers often to avoid stiffness and to lessen swelling.  Raise (elevate) the injured area above the level of your heart while you are sitting or lying down. General instructions  Take  over-the-counter and prescription medicines only as told by your health care provider.  Keep all follow-up visits as told by your health care provider. This is important.  Do any exercise or physical therapy as told by your health care provider.  Do not drive or operate heavy machinery while taking prescription pain medicine.  If you were given an elbow pad, wear it as told by your health care provider. Contact a health care provider if:  Your symptoms get worse.  Your symptoms do not get better with treatment.  Your have new pain.  Your hand on the injured side feels numb or cold. This information is not intended to replace advice given to you by your health care provider. Make sure you discuss any questions you have with your health care provider. Document Released: 08/09/2005 Document Revised: 01/15/2016 Document Reviewed: 10/16/2014 Elsevier Interactive Patient Education  2018 [employer].    Tennis Elbow Rehab Ask your health care provider which exercises are safe for you. Do exercises exactly as told by your health care provider and adjust them as directed. It is normal to feel mild stretching, pulling, tightness, or discomfort as you do these exercises, but you should stop right away if you feel sudden pain or your pain gets worse. Do not begin these exercises until told by your health care provider. Stretching and range of motion exercises These exercises warm up your muscles and joints and improve the movement and flexibility of your elbow. These exercises also help to relieve pain, numbness, and tingling. Exercise A: Wrist extensor stretch 1. Extend your left / right elbow with your fingers pointing down. 2. Gently pull the palm of your left / right hand toward you until you feel a gentle stretch on the top of your forearm. 3. To increase the stretch, push your left / right hand toward the outer edge or pinkie side of your forearm. 4. Hold this position for __________  seconds. Repeat __________ times. Complete this exercise __________ times a day. If directed by your health care provider, repeat this stretch except do it with a bent elbow this time. Exercise B: Wrist flexor stretch  1. Extend your left / right elbow and turn your palm upward. 2. Gently pull your left / right palm and fingertips back so your wrist extends and your fingers point more toward the ground. 3. You should feel a gentle stretch on the inside of your forearm. 4. Hold this position for __________ seconds. Repeat __________ times. Complete this exercise __________ times a day. If directed by your health care provider, repeat this stretch except do it with a bent elbow this time. Strengthening exercises These exercises build strength and endurance in your elbow. Endurance is the ability to use your muscles for a long time, even after they get tired. Exercise C: Wrist extensors  1. Sit with your left / right forearm palm-down and fully supported on a table or countertop. Your elbow should be resting below the  height of your shoulder. 2. Let your left / right wrist extend over the edge of the surface. 3. Loosely hold a __________ weight or a piece of rubber exercise band or tubing in your left / right hand. Slowly curl your left / right hand up toward your forearm. If you are using band or tubing, hold the band or tubing in place with your other hand to provide resistance. 4. Hold this position for __________ seconds. 5. Slowly return to the starting position. Repeat __________ times. Complete this exercise __________ times a day. Exercise D: Radial deviators  1. Stand with a __________ weight in your left / righthand. Or, sit while holding a rubber exercise band or tubing with your other arm supported on a table or countertop. Position your hand so your thumb is on top. 2. Raise your hand upward in front of you so your thumb travels toward your forearm, or pull up on the rubber  tubing. 3. Hold this position for __________ seconds. 4. Slowly return to the starting position. Repeat __________ times. Complete this exercise __________ times a day. Exercise E: Eccentric wrist extensors 1. Sit with your left / right forearm palm-down and fully supported on a table or countertop. Your elbow should be resting below the height of your shoulder. 2. If told by your health care provider, hold a __________ weight in your hand. 3. Let your left / right wrist extend over the edge of the surface. 4. Use your other hand to lift up your left / right hand toward your forearm. Keep your forearm on the table. 5. Using only the muscles in your left / right hand, slowly lower your hand back down to the starting position. Repeat __________ times. Complete this exercise __________ times a day. This information is not intended to replace advice given to you by your health care provider. Make sure you discuss any questions you have with your health care provider. Document Released: 08/09/2005 Document Revised: 04/14/2016 Document Reviewed: 05/08/2015 Elsevier Interactive Patient Education  [employer]2018 [employer]

## 2017-09-06 NOTE — Progress Notes (Signed)
Kyle Duncan is a 42 y.o. male who presents to [facility] today for right hand weakness and arm pain.  Patient with 3 month history right sided hand numbness and weakness in addition to right lateral elbow pain. Patient diagnosed with cubital tunnel syndrome on left side, amenable to physical therapy over the last several years. However, this numbness and weakness is more concerning to patient. Patient notes contralateral right hand numbness in 4th and 5th digits, similar to left side. In addition however, patient noticing weakness in hand with lifting or over-the-head activity. He feels diffusely weak in his right hand and notes that he has significant pain over right lateral elbow with activity. Finas is right hand dominant.   Patient with no history of neck pain. His left cubital tunnel syndrome has been well managed with physical therapy.    Past Medical History:  Diagnosis Date  . H/O: vasectomy 06/09/2016   01/2013  . PSVT (paroxysmal supraventricular tachycardia) (HCC)    No past surgical history on file. Social History   Tobacco Use  . Smoking status: Never Smoker  . Smokeless tobacco: Never Used  Substance Use Topics  . Alcohol use: Not on file     ROS:  As above   Medications: Current Outpatient Medications  Medication Sig Dispense Refill  . diclofenac sodium (VOLTAREN) 1 % GEL Apply 2 g topically 4 (four) times daily. To affected joint. 100 g 11   No current facility-administered medications for this visit.    No Known Allergies   Exam:  BP 124/77   Pulse (!) 50   Ht 5\' 10"  (1.778 m)   Wt 251 lb (113.9 kg)   BMI 36.01 kg/m  General: Well Developed, well nourished, and in no acute distress.  Neuro/Psych: Alert and oriented x3, extra-ocular muscles intact, able to move all 4 extremities, sensation grossly intact. Skin: Warm and dry, no rashes noted.  Respiratory: Not using accessory muscles, speaking in  full sentences, trachea midline.  Cardiovascular: Pulses palpable, no extremity edema. Abdomen: Does not appear distended. MSK:  Right Elbow: Tender to palpation lateral epicondyles Positive Tinel's cubital tunnel Normal ROM  Left Elbow: Non-tender Positive tinel's cubital tunnel Normal ROM  Right Wrist and hand:  Normal appearing Non-tender Normal motion Right elbow pain with resisted wrist and hand extension Normal grip strength and sensation  Left wrist and hand Normal appearing Non-tender Normal motion Normal grip strength and sensation     No results found for this or any previous visit (from the past 48 hour(s)). No results found.    Assessment and Plan: 42 y.o. male with right elbow pain and finger numbness.  Patient has two conditions: 1. Right cubital tunnel syndrome: patient with numbness in 4th and 5th digits consistent with cubital tunnel syndrome. Will treat with overnight splinting. If not amenable to splinting, patient may consider injection and/or surgery post nerve-study.  2. Right lateral epicondylitis: patient with pain at right lateral epicondyle on extension of fingers against resistance, characteristic of lateral epicondylitis. Patient with benefit from home stretching and eccentric strength exercises. Also prescribed diclofenac gel. If not improving, will consider physical therapy.     No orders of the defined types were placed in this encounter.  Meds ordered this encounter  Medications  . diclofenac sodium (VOLTAREN) 1 % GEL    Sig: Apply 2 g topically 4 (four) times daily. To affected joint.    Dispense:  100 g    Refill:  11  Discussed warning signs or symptoms. Please see discharge instructions. Patient expresses understanding.  I spent 25 minutes with this patient, greater than 50% was face-to-face time counseling regarding ddx and treatment plan.

## 2017-09-07 DIAGNOSIS — M7710 Lateral epicondylitis, unspecified elbow: Secondary | ICD-10-CM | POA: Insufficient documentation

## 2017-09-07 DIAGNOSIS — G5623 Lesion of ulnar nerve, bilateral upper limbs: Secondary | ICD-10-CM | POA: Insufficient documentation

## 2020-12-01 ENCOUNTER — Other Ambulatory Visit: Payer: Self-pay

## 2020-12-01 ENCOUNTER — Ambulatory Visit (INDEPENDENT_AMBULATORY_CARE_PROVIDER_SITE_OTHER): Payer: No Typology Code available for payment source | Admitting: Sports Medicine

## 2020-12-01 DIAGNOSIS — M654 Radial styloid tenosynovitis [de Quervain]: Secondary | ICD-10-CM | POA: Diagnosis not present

## 2020-12-01 DIAGNOSIS — N529 Male erectile dysfunction, unspecified: Secondary | ICD-10-CM

## 2020-12-01 MED ORDER — MELOXICAM 15 MG PO TABS: ORAL_TABLET | ORAL | 3 refills | Status: DC

## 2020-12-01 MED ORDER — TADALAFIL 5 MG PO TABS: 5.0000 mg | ORAL_TABLET | Freq: Every day | ORAL | 11 refills | Status: DC

## 2020-12-01 NOTE — Assessment & Plan Note (Signed)
This is a pleasant 45 year old male with a several month history of pain in his right wrist, localized over the radial styloid with radiation of the forearm, worse with working out, he has been doing a lot more exercising lately. He does have a positive Finkelstein sign and tenderness over the first extensor compartment consistent with first extensor compartment tenosynovitis. Adding a thumb spica brace, meloxicam, rehab exercises, return to see me in 2 to 4 weeks for first extensor compartment injection if no better.

## 2020-12-01 NOTE — Assessment & Plan Note (Addendum)
Difficulty with initiation, quality and duration. Has never used a 5 phosphodiesterase inhibitor. Starting with Cialis 5 daily, he will re-establish care here with Dr. Everrett Coombe for further discussion.

## 2020-12-01 NOTE — Progress Notes (Addendum)
    Procedures performed today:    None.  Independent interpretation of notes and tests performed by another provider:   None.  Brief History, Exam, Impression, and Recommendations:    De Quervain's tenosynovitis, right This is a pleasant 45 year old male with a several month history of pain in his right wrist, localized over the radial styloid with radiation of the forearm, worse with working out, he has been doing a lot more exercising lately. He does have a positive Finkelstein sign and tenderness over the first extensor compartment consistent with first extensor compartment tenosynovitis. Adding a thumb spica brace, meloxicam, rehab exercises, return to see me in 2 to 4 weeks for first extensor compartment injection if no better.  Erectile dysfunction Difficulty with initiation, quality and duration. Has never used a 5 phosphodiesterase inhibitor. Starting with Cialis 5 daily, he will re-establish care here with Dr. Everrett Coombe for further discussion.     ___________________________________________ Ihor Austin. Benjamin Stain, M.D., ABFM., CAQSM. Primary Care and Sports Medicine [facility]  Adjunct Instructor of Family Medicine  [employer]

## 2020-12-22 LAB — LIPID PANEL
HDL: 81 — AB (ref 35–70)
Triglycerides: 110 (ref 40–160)

## 2020-12-22 LAB — BASIC METABOLIC PANEL WITH GFR
CO2: 26 — AB (ref 13–22)
Creatinine: 0.9 (ref 0.6–1.3)
Glucose: 101

## 2020-12-22 LAB — VITAMIN B12: Vitamin B-12: 479

## 2020-12-22 LAB — COMPREHENSIVE METABOLIC PANEL
Albumin: 3.8 (ref 3.5–5.0)
Calcium: 8.3 — AB (ref 8.7–10.7)

## 2020-12-22 LAB — CBC: RBC: 4.37 (ref 3.87–5.11)

## 2020-12-22 LAB — CBC AND DIFFERENTIAL
Hemoglobin: 13.9 (ref 13.5–17.5)
WBC: 4.3

## 2020-12-22 LAB — HEPATIC FUNCTION PANEL
Bilirubin, Direct: 0.2 (ref 0.01–0.4)
Bilirubin, Total: 0.7

## 2020-12-23 ENCOUNTER — Ambulatory Visit: Payer: No Typology Code available for payment source | Admitting: Family Medicine

## 2020-12-29 ENCOUNTER — Other Ambulatory Visit: Payer: Self-pay

## 2020-12-29 ENCOUNTER — Ambulatory Visit (INDEPENDENT_AMBULATORY_CARE_PROVIDER_SITE_OTHER): Payer: No Typology Code available for payment source | Admitting: Sports Medicine

## 2020-12-29 DIAGNOSIS — M654 Radial styloid tenosynovitis [de Quervain]: Secondary | ICD-10-CM | POA: Diagnosis not present

## 2020-12-29 DIAGNOSIS — N529 Male erectile dysfunction, unspecified: Secondary | ICD-10-CM

## 2020-12-29 MED ORDER — TADALAFIL 10 MG PO TABS: 10.0000 mg | ORAL_TABLET | Freq: Every day | ORAL | 11 refills | Status: DC

## 2020-12-29 NOTE — Assessment & Plan Note (Signed)
Kyle Duncan returns, he is a pleasant 45 year old male, he is a Games developer at [employer]. We treated him for de Quervain's tendinitis and he has improved considerably. He has developed some ergonomic changes such as using his cell phone in his left hand, and changing his grip on his mouse. Ultimately we can follow this up as needed. No injection needed.

## 2020-12-29 NOTE — Progress Notes (Signed)
    Procedures performed today:    None.  Independent interpretation of notes and tests performed by another provider:   None.  Brief History, Exam, Impression, and Recommendations:    De Quervain's tenosynovitis, right Kyle Duncan returns, he is a pleasant 45 year old male, he is a Games developer at [employer]. We treated him for de Quervain's tendinitis and he has improved considerably. He has developed some ergonomic changes such as using his cell phone in his left hand, and changing his grip on his mouse. Ultimately we can follow this up as needed. No injection needed.  Erectile dysfunction Good improvement with Cialis 5, performance could be a little better so increasing to 10.     ___________________________________________ Kyle Duncan. Kyle Duncan, M.D., ABFM., CAQSM. Primary Care and Sports Medicine [facility]  Adjunct Instructor of Family Medicine  [employer]

## 2020-12-29 NOTE — Assessment & Plan Note (Addendum)
Good improvement with Cialis 5, performance could be a little better so increasing to 10.

## 2020-12-31 ENCOUNTER — Ambulatory Visit: Payer: No Typology Code available for payment source | Admitting: Family Medicine

## 2021-01-13 ENCOUNTER — Ambulatory Visit (INDEPENDENT_AMBULATORY_CARE_PROVIDER_SITE_OTHER): Payer: No Typology Code available for payment source | Admitting: Family Medicine

## 2021-01-13 ENCOUNTER — Other Ambulatory Visit: Payer: Self-pay

## 2021-01-13 ENCOUNTER — Encounter: Payer: Self-pay | Admitting: Family Medicine

## 2021-01-13 VITALS — BP 123/69 | HR 59 | Temp 97.4°F | Ht 70.08 in | Wt 308.9 lb

## 2021-01-13 DIAGNOSIS — R7989 Other specified abnormal findings of blood chemistry: Secondary | ICD-10-CM

## 2021-01-13 DIAGNOSIS — R635 Abnormal weight gain: Secondary | ICD-10-CM

## 2021-01-13 NOTE — Patient Instructions (Signed)
Great to meet you! Have labs completed.   Send me a copy of [facility] once you get them.

## 2021-01-13 NOTE — Assessment & Plan Note (Signed)
Checking testosterone levels.

## 2021-01-13 NOTE — Progress Notes (Signed)
Kyle Duncan - 45 y.o. male MRN [id number]  Date of birth: 07/27/1976  Subjective Chief Complaint  Patient presents with  . Establish Care    HPI Kyle Duncan is a 45 y.o. male here today for initial visit to establish care.  He has history of OSA and PSVT.  He has concerns about difficulty losing weight.  He has been exercising regularly 4-5 days per week and made changes to his diet.  He would like to have updated testosterone labs as these have been low in the past.  He has history of OSA but has not used his CPAP machine due to poor tolerance.  He receives care through the [place] as well and had recent labs but hasn't gotten results back.   ROS:  A comprehensive ROS was completed and negative except as noted per HPI  No Known Allergies  Past Medical History:  Diagnosis Date  . H/O: vasectomy 06/09/2016   01/2013  . PSVT (paroxysmal supraventricular tachycardia) (HCC)     History reviewed. No pertinent surgical history.  Social History   Socioeconomic History  . Marital status: Married    Spouse name: Not on file  . Number of children: Not on file  . Years of education: Not on file  . Highest education level: Not on file  Occupational History  . Not on file  Tobacco Use  . Smoking status: Never Smoker  . Smokeless tobacco: Never Used  Substance and Sexual Activity  . Alcohol use: Not on file  . Drug use: Not on file  . Sexual activity: Not on file  Other Topics Concern  . Not on file  Social History Narrative  . Not on file   Social Determinants of Health   Financial Resource Strain: Not on file  Food Insecurity: Not on file  Transportation Needs: Not on file  Physical Activity: Not on file  Stress: Not on file  Social Connections: Not on file    Family History  Problem Relation Age of Onset  . Cancer Father   . Diabetes Maternal Grandmother   . Hyperlipidemia Maternal Grandmother   . Hypertension Maternal Grandmother   . Breast cancer Maternal  Grandmother   . Heart attack Paternal Grandmother   . Stroke Paternal Grandfather   . Hypertension Mother   . Diabetes Mother   . Colon cancer Maternal Aunt   . Stroke Paternal Uncle   . Hypertension Maternal Grandfather   . Heart disease Maternal Grandfather   . Skin cancer Maternal Grandfather     Health Maintenance  Topic Date Due  . COVID-19 Vaccine (1) Never done  . HIV Screening  Never done  . Hepatitis C Screening  Never done  . COLONOSCOPY (Pts 45-81yrs Insurance coverage will need to be confirmed)  Never done  . INFLUENZA VACCINE  03/23/2021  . TETANUS/TDAP  03/11/2027  . HPV VACCINES  Aged Out     ----------------------------------------------------------------------------------------------------------------------------------------------------------------------------------------------------------------- Physical Exam BP 123/69 (BP Location: Left Arm, Patient Position: Sitting, Cuff Size: Large)   Pulse (!) 59   Temp (!) 97.4 F (36.3 C)   Ht 5' 10.08" (1.78 m)   Wt (!) 308 lb 14.4 oz (140.1 kg)   SpO2 100%   BMI 44.22 kg/m   Physical Exam Constitutional:      Appearance: Normal appearance.  Eyes:     General: No scleral icterus. Cardiovascular:     Rate and Rhythm: Normal rate and regular rhythm.  Pulmonary:     Effort: Pulmonary effort is  normal.     Breath sounds: Normal breath sounds.  Musculoskeletal:     Cervical back: Neck supple.  Skin:    General: Skin is warm and dry.  Neurological:     General: No focal deficit present.     Mental Status: He is alert.  Psychiatric:        Mood and Affect: Mood normal.        Behavior: Behavior normal.     ------------------------------------------------------------------------------------------------------------------------------------------------------------------------------------------------------------------- Assessment and Plan  Decreased testosterone level Checking testosterone levels.    Abnormal weight gain Orders Placed This Encounter  Procedures  . Testosterone Total,Free,Bio, Males  . TSH + free T4  . HgB A1c   He will also send labs from [place] over.  Discussed that untreated OSA can contribute to difficulty losing weight as well.    No orders of the defined types were placed in this encounter.   No follow-ups on file.    This visit occurred during the SARS-CoV-2 public health emergency.  Safety protocols were in place, including screening questions prior to the visit, additional usage of staff PPE, and extensive cleaning of exam room while observing appropriate contact time as indicated for disinfecting solutions.

## 2021-01-13 NOTE — Assessment & Plan Note (Signed)
Orders Placed This Encounter  Procedures  . Testosterone Total,Free,Bio, Males  . TSH + free T4  . HgB A1c   He will also send labs from [place] over.  Discussed that untreated OSA can contribute to difficulty losing weight as well.

## 2021-01-16 LAB — TESTOSTERONE TOTAL,FREE,BIO, MALES
Albumin: 4.4 g/dL (ref 3.6–5.1)
Sex Hormone Binding: 18 nmol/L (ref 10–50)
Testosterone, Bioavailable: 127.7 ng/dL (ref 110.0–575.0)
Testosterone, Free: 63.5 pg/mL (ref 46.0–224.0)
Testosterone: 315 ng/dL (ref 250–827)

## 2021-01-16 LAB — HEMOGLOBIN A1C
Hgb A1c MFr Bld: 5.5 % of total Hgb (ref <5.7)
Mean Plasma Glucose: 111 mg/dL
eAG (mmol/L): 6.2 mmol/L

## 2021-01-16 LAB — TSH+FREE T4: TSH W/REFLEX TO FT4: 1.95 mIU/L (ref 0.40–4.50)

## 2021-01-20 ENCOUNTER — Encounter: Payer: Self-pay | Admitting: Family Medicine

## 2021-02-05 ENCOUNTER — Ambulatory Visit (INDEPENDENT_AMBULATORY_CARE_PROVIDER_SITE_OTHER): Payer: No Typology Code available for payment source

## 2021-02-05 ENCOUNTER — Other Ambulatory Visit: Payer: Self-pay

## 2021-02-05 ENCOUNTER — Other Ambulatory Visit: Payer: Self-pay | Admitting: General Practice

## 2021-02-05 DIAGNOSIS — M545 Low back pain, unspecified: Secondary | ICD-10-CM

## 2021-03-25 ENCOUNTER — Ambulatory Visit (INDEPENDENT_AMBULATORY_CARE_PROVIDER_SITE_OTHER): Payer: No Typology Code available for payment source | Admitting: Medical-Surgical

## 2021-03-25 ENCOUNTER — Encounter: Payer: Self-pay | Admitting: Medical-Surgical

## 2021-03-25 VITALS — BP 124/71 | HR 60 | Temp 98.0°F | Resp 20 | Ht 70.08 in | Wt 309.0 lb

## 2021-03-25 DIAGNOSIS — G4733 Obstructive sleep apnea (adult) (pediatric): Secondary | ICD-10-CM

## 2021-03-25 DIAGNOSIS — R635 Abnormal weight gain: Secondary | ICD-10-CM

## 2021-03-25 DIAGNOSIS — J3089 Other allergic rhinitis: Secondary | ICD-10-CM | POA: Diagnosis not present

## 2021-03-25 MED ORDER — PREDNISONE 50 MG PO TABS: 50.0000 mg | ORAL_TABLET | Freq: Every day | ORAL | 0 refills | Status: DC

## 2021-03-25 MED ORDER — IPRATROPIUM BROMIDE 0.03 % NA SOLN: 2.0000 | Freq: Two times a day (BID) | NASAL | 0 refills | Status: DC

## 2021-03-25 MED ORDER — LEVOCETIRIZINE DIHYDROCHLORIDE 5 MG PO TABS: 5.0000 mg | ORAL_TABLET | Freq: Every evening | ORAL | 1 refills | Status: DC

## 2021-03-25 NOTE — Progress Notes (Signed)
HPI with pertinent ROS:   CC: sinus symptoms  HPI: Pleasant 45 year old male presenting today with complaints of approximately 2 weeks of sinus symptoms.  He is experiencing sneezing with sinus congestion, rhinorrhea, mild dry cough, watery/itchy eyes, maxillary sinus/pressure, and postnasal drip.  He works inside in an air conditioned environment which he notes has seemed to make it worse.  They have changed the filters at both at home and at work but his symptoms do not seem to have improved.  Interestingly enough, his symptoms do seem to get better when he goes outside in the heat.  He does have a history of developing frequent cold and sinus infections with exposure to hitting dust and dirt environments.  He has been taking Zyrtec every evening but this does not seem to have helped over the past week.  No fevers, chills, chest pain, shortness of breath, body aches, or GI symptoms.  Weight concerns-he is very frustrated with his efforts to lose weight.  His weight never seems to go beyond 293 pounds despite his efforts.  He does work in a very sedentary job which he knows is a downfall that he compensates by going to the gym 3-4 times weekly to do strength/resistance training.  He is using [place] workout and goes to workout supplements in addition to protein supplements.  Does have issues trying to find healthy food options when he travels so much.  He spoke with Dr. Ashley Royalty earlier this year regarding weight issues and there was a mention of sleep apnea possibly contributing to his weight issues.  He has a CPAP at home but it is very large and difficult to travel with so he has not used this in quite a while.  He is interested in getting a CPAP he can use while traveling.  Does note that he has seen a change in his body composition as he works to build more muscle.  Has a history of having some cardiac arrhythmias with extreme stress and would like to avoid medications that may exacerbate that.  He is  not interested in injection medications to help with weight loss.  I reviewed the past medical history, family history, social history, surgical history, and allergies today and no changes were needed.  Please see the problem list section below in epic for further details.   Physical exam:   General: Well Developed, well nourished, and in no acute distress.  Neuro: Alert and oriented x3.  HEENT: Normocephalic, atraumatic, pupils equal round reactive to light, neck supple, no masses, no lymphadenopathy, thyroid nonpalpable.  Skin: Warm and dry. Cardiac: Regular rate and rhythm, no murmurs rubs or gallops, no lower extremity edema.  Respiratory: Clear to auscultation bilaterally. Not using accessory muscles, speaking in full sentences.  Impression and Recommendations:    1. Non-seasonal allergic rhinitis, unspecified trigger Discontinue Zyrtec.  Start Xyzal 5 mg nightly.  Atrovent nasal spray twice daily as needed.  We will go ahead and do a burst of prednisone 50 mg x 5 days to see if we can get ahead of this before it turns into a sinus infection.  Consider possible environmental allergen exposure or the development of a new allergy.  If this continues without relief, we may need to get a referral to an allergist for testing.  2. OSA (obstructive sleep apnea) Agree that sleep apnea may be contributing to his weight concerns.  Referring to sleep studies to evaluate further options for CPAP that is conducive to traveling. - Ambulatory referral to  Sleep Studies  3. Weight gain Discussed the importance of eating protein in the diet but also eating an appropriate portion for carbohydrates and fats.  Recommended looking into the importance of balanced macros in the diet since getting the appropriate portions can make a difference.  Discussed monitoring the fit of clothing and body composition changes rather than a number on a scale since this will likely provide a better idea of progress.  Return  if symptoms worsen or fail to improve. ___________________________________________ Thayer Ohm, DNP, APRN, FNP-BC Primary Care and Sports Medicine [facility] [place]

## 2021-04-01 ENCOUNTER — Other Ambulatory Visit: Payer: Self-pay

## 2021-04-01 ENCOUNTER — Emergency Department (INDEPENDENT_AMBULATORY_CARE_PROVIDER_SITE_OTHER)
Admission: EM | Admit: 2021-04-01 | Discharge: 2021-04-01 | Disposition: A | Discharge status: Home / Self Care | Payer: No Typology Code available for payment source | Source: Home / Self Care

## 2021-04-01 DIAGNOSIS — M5441 Lumbago with sciatica, right side: Secondary | ICD-10-CM

## 2021-04-01 MED ORDER — CYCLOBENZAPRINE HCL 10 MG PO TABS: 10.0000 mg | ORAL_TABLET | Freq: Every day | ORAL | 0 refills | Status: DC

## 2021-04-01 MED ORDER — METHYLPREDNISOLONE 4 MG PO TBPK: ORAL_TABLET | ORAL | 0 refills | Status: DC

## 2021-04-01 MED ORDER — IBUPROFEN 800 MG PO TABS: 800.0000 mg | ORAL_TABLET | Freq: Three times a day (TID) | ORAL | 0 refills | Status: DC

## 2021-04-01 NOTE — Discharge Instructions (Addendum)
Take the Medrol Dosepak as directed.  This is the steroid anti-inflammatory.  Take all of day 1 today (3 now and 3 at bedtime) Take Flexeril at bedtime.  This is a muscle relaxer.  It causes drowsiness.  May take twice a day if you are not working, driving, operating machinery After the Medrol Dosepak you may go back on ibuprofen.  3 times a day with food If not improving as expected, call for appointment with Dr. Benjamin Stain or your PCP

## 2021-04-01 NOTE — ED Provider Notes (Signed)
Kyle Duncan CARE    CSN: [id number] Arrival date & time: 04/01/21  1503      History   Chief Complaint Chief Complaint  Patient presents with   Back Pain   Leg Pain    RT    HPI Kyle Duncan is a 45 y.o. male.   HPI  Patient is here for back pain.  He states he was at the gym working out when he increased his weights doing his bicep curls.  He was standing in front of machine, and pulled upward on the bar and felt a tight painful sensation in his right low back.  He sat down easily.  He did not continue with exercise.  Ever since symptoms pain is right low back that goes through the right buttock and all the way down to his toes.  He recognizes this as sciatica.  No numbness.  No weakness.  No bowel or bladder complaint.  He is having difficulty sleeping.  He has difficulty driving in the car, cannot sit comfortably for long periods of time.  He had x-rays for this several months ago.  X-rays show minor spondylosis   Past Medical History:  Diagnosis Date   H/O: vasectomy 06/09/2016   01/2013   PSVT (paroxysmal supraventricular tachycardia) [facility])     Patient Active Problem List   Diagnosis Date Noted   Abnormal weight gain 01/13/2021   De Quervain's tenosynovitis, right 12/01/2020   Erectile dysfunction 12/01/2020   Cubital tunnel syndrome of both upper extremities 09/07/2017   Tennis elbow 09/07/2017   H/O: vasectomy 06/09/2016   Decreased testosterone level 06/03/2016   History of PSVT (paroxysmal supraventricular tachycardia) 05/31/2016   Bradycardia 05/31/2016   Fatigue 05/31/2016   Decreased libido 05/31/2016   Weight gain 05/31/2016    History reviewed. No pertinent surgical history.     Home Medications    Prior to Admission medications   Medication Sig Start Date End Date Taking? Authorizing Provider  cyclobenzaprine (FLEXERIL) 10 MG tablet Take 1 tablet (10 mg total) by mouth at bedtime. 04/01/21  Yes Eustace Moore, MD  ibuprofen  (ADVIL) 800 MG tablet Take 1 tablet (800 mg total) by mouth 3 (three) times daily. 04/01/21  Yes Eustace Moore, MD  methylPREDNISolone (MEDROL DOSEPAK) 4 MG TBPK tablet tad 04/01/21  Yes Eustace Moore, MD  ipratropium (ATROVENT) 0.03 % nasal spray Place 2 sprays into both nostrils every 12 (twelve) hours. 03/25/21   Christen Butter, NP  meloxicam (MOBIC) 15 MG tablet One tab PO qAM with a meal for 2 weeks, then daily prn pain. 12/01/20   Monica Becton, MD  Multiple Vitamin (MULTIVITAMIN WITH MINERALS) TABS tablet Take 1 tablet by mouth daily.    [provider]  tadalafil (CIALIS) 10 MG tablet Take 1 tablet (10 mg total) by mouth daily. 12/29/20   Monica Becton, MD  Whey Protein POWD Take by mouth.    [provider]    Family History Family History  Problem Relation Age of Onset   Cancer Father    Diabetes Maternal Grandmother    Hyperlipidemia Maternal Grandmother    Hypertension Maternal Grandmother    Breast cancer Maternal Grandmother    Heart attack Paternal Grandmother    Stroke Paternal Grandfather    Hypertension Mother    Diabetes Mother    Colon cancer Maternal Aunt    Stroke Paternal Uncle    Hypertension Maternal Grandfather    Heart disease Maternal Grandfather  Skin cancer Maternal Grandfather     Social History Social History   Tobacco Use   Smoking status: Never   Smokeless tobacco: Never     Allergies   Patient has no known allergies.   Review of Systems Review of Systems See HPI  Physical Exam Triage Vital Signs ED Triage Vitals  Enc Vitals Group     BP 04/01/21 1511 122/77     Pulse Rate 04/01/21 1511 81     Resp 04/01/21 1511 18     Temp 04/01/21 1511 97.9 F (36.6 C)     Temp Source 04/01/21 1511 Oral     SpO2 04/01/21 1511 98 %     Weight --      Height --      Head Circumference --      Peak Flow --      Pain Score 04/01/21 1512 6     Pain Loc --      Pain Edu? --      Excl. in GC? --    No  data found.  Updated Vital Signs BP 122/77 (BP Location: Right Arm)   Pulse 81   Temp 97.9 F (36.6 C) (Oral)   Resp 18   SpO2 98%       Physical Exam Constitutional:      General: He is not in acute distress.    Appearance: He is well-developed.  HENT:     Head: Normocephalic and atraumatic.     Mouth/Throat:     Comments: Mask is in place Eyes:     Conjunctiva/sclera: Conjunctivae normal.     Pupils: Pupils are equal, round, and reactive to light.  Cardiovascular:     Rate and Rhythm: Normal rate.  Pulmonary:     Effort: Pulmonary effort is normal. No respiratory distress.  Abdominal:     General: There is no distension.     Palpations: Abdomen is soft.     Comments: Protuberant abdomen  Musculoskeletal:        General: Normal range of motion.     Cervical back: Normal range of motion.     Comments: No tenderness in the lumbar column of muscles.  No tenderness centrally over the lumbar spinous processes.  Mild tenderness to deep palpation of the lower right SI region.  No tenderness over the posterior pelvis or sacrum.  Reflexes are 2+ and equal at the knee 1+ and equal at the ankle.  Straight leg raise on the right causes positive increased pain at 80 degrees of extension.  Left straight leg raise is negative .no weakness  Skin:    General: Skin is warm and dry.  Neurological:     Mental Status: He is alert.     Gait: Gait abnormal.     UC Treatments / Results  Labs (all labs ordered are listed, but only abnormal results are displayed) Labs Reviewed - No data to display  EKG   Radiology No results found.  Procedures Procedures (including critical care time)  Medications Ordered in UC Medications - No data to display  Initial Impression / Assessment and Plan / UC Course  I have reviewed the triage vital signs and the nursing notes.  Pertinent labs & imaging results that were available during my care of the patient were reviewed by me and considered in  my medical decision making (see chart for details).     Patient has right low back pain with nerve root inflammation.  We will treat  with a prednisone pack.  Cyclobenzaprine to help sleep.  Ibuprofen prescription for future use (Depo-Medrol).  Follow-up with sports medicine. Final Clinical Impressions(s) / UC Diagnoses   Final diagnoses:  Acute right-sided low back pain with right-sided sciatica     Discharge Instructions      Take the Medrol Dosepak as directed.  This is the steroid anti-inflammatory.  Take all of day 1 today (3 now and 3 at bedtime) Take Flexeril at bedtime.  This is a muscle relaxer.  It causes drowsiness.  May take twice a day if you are not working, driving, operating machinery After the Medrol Dosepak you may go back on ibuprofen.  3 times a day with food If not improving as expected, call for appointment with Dr. Benjamin Stain or your PCP   ED Prescriptions     Medication Sig Dispense Auth. Provider   methylPREDNISolone (MEDROL DOSEPAK) 4 MG TBPK tablet tad 21 tablet Eustace Moore, MD   cyclobenzaprine (FLEXERIL) 10 MG tablet Take 1 tablet (10 mg total) by mouth at bedtime. 20 tablet Eustace Moore, MD   ibuprofen (ADVIL) 800 MG tablet Take 1 tablet (800 mg total) by mouth 3 (three) times daily. 21 tablet Eustace Moore, MD      PDMP not reviewed this encounter.   Eustace Moore, MD 04/01/21 1556

## 2021-04-01 NOTE — ED Triage Notes (Signed)
Pt c/o Back pain since Sunday after working out at gym. Says he felt a pop in the center of his back. Radiates down RT leg. Says the tingling extends all the way to toes. Heat/Ice and Advil prn with no relief. Pain 6/10

## 2021-04-07 ENCOUNTER — Encounter: Payer: Self-pay | Admitting: Medical-Surgical

## 2021-04-07 ENCOUNTER — Other Ambulatory Visit: Payer: Self-pay

## 2021-04-07 ENCOUNTER — Ambulatory Visit (INDEPENDENT_AMBULATORY_CARE_PROVIDER_SITE_OTHER): Payer: No Typology Code available for payment source

## 2021-04-07 ENCOUNTER — Ambulatory Visit (INDEPENDENT_AMBULATORY_CARE_PROVIDER_SITE_OTHER): Payer: No Typology Code available for payment source | Admitting: Medical-Surgical

## 2021-04-07 VITALS — BP 122/79 | HR 83 | Resp 20 | Wt 303.2 lb

## 2021-04-07 DIAGNOSIS — M5441 Lumbago with sciatica, right side: Secondary | ICD-10-CM | POA: Diagnosis not present

## 2021-04-07 DIAGNOSIS — G5731 Lesion of lateral popliteal nerve, right lower limb: Secondary | ICD-10-CM

## 2021-04-07 DIAGNOSIS — R200 Anesthesia of skin: Secondary | ICD-10-CM

## 2021-04-07 MED ORDER — KETOROLAC TROMETHAMINE 60 MG/2ML IM SOLN
60.0000 mg | Freq: Once | INTRAMUSCULAR | Status: AC
Start: 2021-04-07 — End: 2021-04-07
  Administered 2021-04-07: 60 mg via INTRAMUSCULAR

## 2021-04-07 MED ORDER — TRAMADOL HCL 50 MG PO TABS: 50.0000 mg | ORAL_TABLET | Freq: Three times a day (TID) | ORAL | 0 refills | Status: AC | PRN

## 2021-04-07 NOTE — Progress Notes (Addendum)
  HPI with pertinent ROS:   CC: back pain  HPI: Pleasant 45 year old male presenting for evaluation of right back pain with sciatica extending down the right leg to the foot. He was at the gym doing bicep curls when he felt a twinge in his low back along the right side. He stopped his exercise and went to the massage chair for a bit. He did take a Meloxicam once but it didn't work.  He was in considerable pain, especially when driving long distances.  He did talk to his chiropractor who recommended certain stretches to do.  He has done these for several days but has not noticed any benefit.  Today, he is having difficulty with pain shooting down into his right buttock and his lateral/posterior thigh.  He also has numbness extending along the lateral lower right leg into the foot.  He is unable to dorsiflex his toes and his foot feels somewhat numb.  He was seen in urgent care on 8/10 and prescribed Medrol Dosepak, Flexeril, and ibuprofen.  He has been taking these medications as prescribed although he is only doing the Flexeril at night due to sedation.  He has finished the Medrol Dosepak and reports that there has been a slight improvement in symptoms but he is still not able to dorsiflex his toes/foot on the right side.  I reviewed the past medical history, family history, social history, surgical history, and allergies today and no changes were needed.  Please see the problem list section below in epic for further details.   Physical exam:   General: Well Developed, well nourished, and in no acute distress.  Neuro: Alert and oriented x3.  HEENT: Normocephalic, atraumatic.  Skin: Warm and dry. Cardiac: Regular rate and rhythm, no murmurs rubs or gallops, no lower extremity edema.  Respiratory: Clear to auscultation bilaterally. Not using accessory muscles, speaking in full sentences.  Impression and Recommendations:    1. Acute right-sided low back pain with right-sided sciatica 2. Neuropathy  of right peroneal nerve 3.  Numbness of foot, right No imaging obtained in urgent care so getting lumbar spine x-rays today.  He is in considerable pain despite Medrol Dosepak.  Toradol 60 mg IM x1.  Sending in a prescription for tramadol 50 mg 3 times daily as needed.  Recommend ibuprofen 800 mg every 8 hours rather than once or twice daily to help with inflammation.  Referring to physical therapy.  Some concern for the neuropathy and weakness so ordering MRI today. - DG Lumbar Spine Complete; Future - ketorolac (TORADOL) injection 60 mg - Ambulatory referral to Physical Therapy  Return in about 4 weeks (around 05/05/2021) for back pain follow up with Dr. Karie Schwalbe. ___________________________________________ Thayer Ohm, DNP, APRN, FNP-BC Primary Care and Sports Medicine [facility]

## 2021-04-08 ENCOUNTER — Encounter: Payer: Self-pay | Admitting: Medical-Surgical

## 2021-04-12 ENCOUNTER — Other Ambulatory Visit: Payer: Self-pay

## 2021-04-12 ENCOUNTER — Ambulatory Visit (INDEPENDENT_AMBULATORY_CARE_PROVIDER_SITE_OTHER): Payer: No Typology Code available for payment source

## 2021-04-12 DIAGNOSIS — M5441 Lumbago with sciatica, right side: Secondary | ICD-10-CM | POA: Diagnosis not present

## 2021-04-12 DIAGNOSIS — R200 Anesthesia of skin: Secondary | ICD-10-CM

## 2021-04-12 DIAGNOSIS — G5731 Lesion of lateral popliteal nerve, right lower limb: Secondary | ICD-10-CM

## 2021-04-13 ENCOUNTER — Encounter: Payer: Self-pay | Admitting: Medical-Surgical

## 2021-04-22 ENCOUNTER — Ambulatory Visit (INDEPENDENT_AMBULATORY_CARE_PROVIDER_SITE_OTHER): Payer: No Typology Code available for payment source | Admitting: Physical Therapy

## 2021-04-22 ENCOUNTER — Other Ambulatory Visit: Payer: Self-pay

## 2021-04-22 ENCOUNTER — Encounter: Payer: Self-pay | Admitting: Physical Therapy

## 2021-04-22 DIAGNOSIS — M6281 Muscle weakness (generalized): Secondary | ICD-10-CM

## 2021-04-22 DIAGNOSIS — M5441 Lumbago with sciatica, right side: Secondary | ICD-10-CM | POA: Diagnosis not present

## 2021-04-22 DIAGNOSIS — R293 Abnormal posture: Secondary | ICD-10-CM | POA: Diagnosis not present

## 2021-04-22 DIAGNOSIS — R29898 Other symptoms and signs involving the musculoskeletal system: Secondary | ICD-10-CM

## 2021-04-22 NOTE — Therapy (Addendum)
[address] [place], [place], [address] Phone: [phone]   Fax:  [phone]  Physical Therapy Evaluation and Discharge  Patient Details  Name: Kyle Duncan MRN: [id number] Date of Birth: [birth year] Referring Provider (PT): Samuel Bouche   Encounter Date: 04/22/2021   PT End of Session - 04/22/21 1534     Visit Number 1    Number of Visits 12    Date for PT Re-Evaluation 06/03/21    PT Start Time 1430    PT Stop Time 1515    PT Time Calculation (min) 45 min    Activity Tolerance Patient tolerated treatment well    Behavior During Therapy [facility] for tasks assessed/performed             Past Medical History:  Diagnosis Date   H/O: vasectomy 06/09/2016   01/2013   PSVT (paroxysmal supraventricular tachycardia) ([place])     No past surgical history on file.  There were no vitals filed for this visit.    Subjective Assessment - 04/22/21 1429     Subjective Pt was working out at the gym 3-4 weeks ago doing barbell curls on a cable machine and felt something in his back. He is having pain in his Rt hip, low back and  is having "pins and needles" feeling in his foot and toes and is having difficulty with toe extension (DF). Pt has continued to work out but is not doing "core or legs". Pt says symptoms have been decreasing slowly but he wants to be able to return to the gym and move his toes.    Limitations Sitting    Diagnostic tests MRI shows Disc extrusion L5-S1 and disc bulge L2-L3 and L4-L5    Patient Stated Goals move toes, decrease pain, return to working out    Currently in Pain? Yes    Pain Score 4     Pain Location Buttocks    Pain Orientation Right    Pain Descriptors / Indicators Aching    Pain Type Acute pain    Pain Radiating Towards Rt foot    Pain Onset 1 to 4 weeks ago    Pain Frequency Intermittent    Aggravating Factors  prolonged sitting or standing or stretching    Pain Relieving  Factors meds                [facility] PT Assessment - 04/22/21 0001       Assessment   Medical Diagnosis acute Rt sided LBP with Rt sciatica    Referring Provider (PT) Samuel Bouche    Onset Date/Surgical Date 03/30/21      Precautions   Precautions None      Balance Screen   Has the patient fallen in the past 6 months No      Prior Function   Level of Independence Independent    Vocation Full time employment    Print production planner at [employer]      Observation/Other Assessments   Observations pain and replication of radicular symptoms with repeated Rt hip extension    Focus on Therapeutic Outcomes (FOTO)  65 functional status measure      Sensation   Light Touch --   pins and needles Rt dorsum of foot     Posture/Postural Control   Posture Comments sits in Lt lateral flexion to reduce pressure on Rt side      ROM / Strength   AROM / PROM /  Strength AROM;Strength      AROM   AROM Assessment Site Lumbar    Lumbar Flexion WFL - pain end range    Lumbar Extension WFL    Lumbar - Right Side Bend limited 50% - pain    Lumbar - Left Side Bend WFL    Lumbar - Right Rotation limited 25% - pain    Lumbar - Left Rotation WFL      Strength   Overall Strength Comments Rt ankle DF 4+/5, Rt toe extension 1/5 (trace)    Strength Assessment Site Hip    Right/Left Hip Right;Left    Right Hip Flexion 4+/5    Right Hip Extension 4-/5    Right Hip ABduction 4-/5    Left Hip Flexion 4+/5    Left Hip Extension 4+/5    Left Hip ABduction 4+/5      Flexibility   Soft Tissue Assessment /Muscle Length yes    Hamstrings decreased bilat    Piriformis decreased bilat      Palpation   SI assessment  hypomobile with UPAs on Rt SIJ    Palpation comment TTP Rt SIJ, glutes      Special Tests    Special Tests Lumbar    Lumbar Tests Straight Leg Raise      Straight Leg Raise   Findings Positive    Side  Right                         Objective measurements completed on examination: See above findings.       [facility] Adult PT Treatment/Exercise - 04/22/21 0001       Exercises   Exercises Lumbar      Lumbar Exercises: Stretches   Passive Hamstring Stretch Right;Left;20 seconds    Passive Hamstring Stretch Limitations seated    Piriformis Stretch Right;Left;20 seconds      Lumbar Exercises: Standing   Wall Slides 10 reps      Lumbar Exercises: Supine   Ab Set 5 reps;5 seconds      Lumbar Exercises: Sidelying   Hip Abduction 10 reps                    PT Education - 04/22/21 1511     Education Details PT POC and goals, HEP    Person(s) Educated Patient    Methods Explanation;Demonstration;Handout    Comprehension Verbalized understanding;Returned demonstration                 PT Long Term Goals - 04/22/21 1542       PT LONG TERM GOAL #1   Title Pt will be independent in HEP    Time 6    Period Weeks    Status New    Target Date 06/03/21      PT LONG TERM GOAL #2   Title Pt will improve FOTO to >= 78 to demo improved functional mobility    Time 6    Period Weeks    Status New    Target Date 06/03/21      PT LONG TERM GOAL #3   Title Pt will improve toe extension strength 3/5 to improve gait and balance    Time 6    Period Weeks    Status New    Target Date 06/03/21      PT LONG TERM GOAL #4   Title Pt will improve lumbar ROM in flexion and Rt side bending to [facility]  to improve ability to work out with decreased pain    Time 6    Period Weeks    Status New    Target Date 06/03/21                    Plan - 04/22/21 1535     Clinical Impression Statement Pt is a 45 y/o male referred for LBP with sciatica. Pt presents with decreased Rt LE and core strength, decreased activity tolerance, impaired ROM and impaired posture. Pt will benefit from skilled PT to address deficits and improve functional mobility    Personal Factors and  Comorbidities Fitness;Past/Current Experience    Examination-Activity Limitations Squat    Examination-Participation Restrictions Community Activity    Stability/Clinical Decision Making Stable/Uncomplicated    Clinical Decision Making Low    Rehab Potential Good    PT Frequency 2x / week    PT Duration 6 weeks    PT Treatment/Interventions Patient/family education;Aquatic Therapy;Cryotherapy;Traction;Moist Heat;Electrical Stimulation;Iontophoresis 30m/ml Dexamethasone;Balance training;Neuromuscular re-education;Therapeutic exercise;Therapeutic activities;Manual techniques;Passive range of motion;Taping;Dry needling    PT Next Visit Plan assess HEP, progress core and functional strength    PT Home Exercise Plan [email or web address]    Consulted and Agree with Plan of Care Patient             Patient will benefit from skilled therapeutic intervention in order to improve the following deficits and impairments:  Pain, Decreased strength, Decreased activity tolerance, Postural dysfunction, Decreased mobility, Hypomobility, Decreased range of motion  Visit Diagnosis: Acute right-sided low back pain with right-sided sciatica - Plan: PT plan of care cert/re-cert  Muscle weakness (generalized) - Plan: PT plan of care cert/re-cert  Abnormal posture - Plan: PT plan of care cert/re-cert  Other symptoms and signs involving the musculoskeletal system - Plan: PT plan of care cert/re-cert     Problem List Patient Active Problem List   Diagnosis Date Noted   Abnormal weight gain 01/13/2021   De Quervain's tenosynovitis, right 12/01/2020   Erectile dysfunction 12/01/2020   Cubital tunnel syndrome of both upper extremities 09/07/2017   Tennis elbow 09/07/2017   H/O: vasectomy 06/09/2016   Decreased testosterone level 06/03/2016   History of PSVT (paroxysmal supraventricular tachycardia) 05/31/2016   Bradycardia 05/31/2016   Fatigue 05/31/2016   Decreased libido 05/31/2016   Weight gain 05/31/2016   PHYSICAL THERAPY DISCHARGE SUMMARY  Visits from Start of Care: 1  Current functional level related to goals / functional outcomes: None, eval only   Remaining deficits: See above   Education / Equipment: HEP   Patient agrees to discharge. Patient goals were not met. Patient is being discharged due to not returning since the last visit. KIsabelle Course PT,DPT09/28/222:45 PM   DIsabelle Course PT  DIsabelle Course8/31/2022, 4:10 PM  [facility] [address]Phone: [phone]  Fax:  [phone] Name: Kyle SanjuanMRN: [id number] of Birth: 123-Sep-1977

## 2021-04-22 NOTE — Patient Instructions (Signed)
Access Code: [email or web address] URL: [email or web address] Date: 04/22/2021 Prepared by: Reggy Eye  Exercises Seated Hamstring Stretch - 1 x daily - 7 x weekly - 3 sets - 1 reps - 20-30 sec hold Seated Piriformis Stretch - 1 x daily - 7 x weekly - 3 sets - 1 reps - 20-30 sec hold Hooklying Transversus Abdominis Palpation - 1 x daily - 7 x weekly - 1 sets - 10 reps - 5 seconds hold Supine Transversus Abdominis Bracing with Double Leg Fallout - 1 x daily - 7 x weekly - 1 sets - 10 reps Supine Transversus Abdominis Bracing with Leg Extension - 1 x daily - 7 x weekly - 1 sets - 10 reps Sidelying Hip Abduction - 1 x daily - 7 x weekly - 3 sets - 10 reps Wall Squat - 1 x daily - 7 x weekly - 3 sets - 10 reps

## 2021-05-05 ENCOUNTER — Ambulatory Visit (INDEPENDENT_AMBULATORY_CARE_PROVIDER_SITE_OTHER): Payer: No Typology Code available for payment source | Admitting: Sports Medicine

## 2021-05-05 DIAGNOSIS — M5416 Radiculopathy, lumbar region: Secondary | ICD-10-CM | POA: Diagnosis not present

## 2021-05-05 DIAGNOSIS — M7522 Bicipital tendinitis, left shoulder: Secondary | ICD-10-CM | POA: Diagnosis not present

## 2021-05-05 NOTE — Assessment & Plan Note (Addendum)
This is a very pleasant 45 year old male, unfortunately he has had increasing pain in his back with radiation down the right leg to the foot, his dominant finding is right EHL weakness/great toe extension. His MRI was personally reviewed and does show a good sized disc extrusion that appears to compress the L5 nerve root on the right explaining his symptoms. He did have some steroids without significant improvement of the weakness, we will proceed with a right L5-S1 transforaminal epidural however with his symptoms I do think he is going to need an urgent referral to Dr. Yevette Edwards.

## 2021-05-05 NOTE — Assessment & Plan Note (Signed)
Left distal biceps tendinitis, adding an elbow sleeve, biceps conditioning, return to see me in 4 weeks, distal biceps injection with ultrasound guidance from a dorsal approach if not better.

## 2021-05-05 NOTE — Progress Notes (Signed)
    Procedures performed today:    None.  Independent interpretation of notes and tests performed by another provider:   MRI personally reviewed, there are several degenerative processes but the dominant finding is an L5-S1 disc extrusion with clear compression of the right L5 nerve root.  Brief History, Exam, Impression, and Recommendations:    Right lumbar radiculopathy This is a very pleasant 45 year old male, unfortunately he has had increasing pain in his back with radiation down the right leg to the foot, his dominant finding is right EHL weakness/great toe extension. His MRI was personally reviewed and does show a good sized disc extrusion that appears to compress the L5 nerve root on the right explaining his symptoms. He did have some steroids without significant improvement of the weakness, we will proceed with a right L5-S1 transforaminal epidural however with his symptoms I do think he is going to need an urgent referral to Dr. Yevette Edwards.  Biceps tendinitis, left, distal Left distal biceps tendinitis, adding an elbow sleeve, biceps conditioning, return to see me in 4 weeks, distal biceps injection with ultrasound guidance from a dorsal approach if not better.    ___________________________________________ Ihor Austin. Benjamin Stain, M.D., ABFM., CAQSM. Primary Care and Sports Medicine [facility]  Adjunct Instructor of Family Medicine  [employer]

## 2021-05-22 ENCOUNTER — Other Ambulatory Visit: Payer: No Typology Code available for payment source

## 2021-06-02 ENCOUNTER — Ambulatory Visit (INDEPENDENT_AMBULATORY_CARE_PROVIDER_SITE_OTHER): Payer: No Typology Code available for payment source | Admitting: Sports Medicine

## 2021-06-02 ENCOUNTER — Ambulatory Visit (INDEPENDENT_AMBULATORY_CARE_PROVIDER_SITE_OTHER): Payer: No Typology Code available for payment source

## 2021-06-02 ENCOUNTER — Other Ambulatory Visit: Payer: Self-pay

## 2021-06-02 DIAGNOSIS — M5416 Radiculopathy, lumbar region: Secondary | ICD-10-CM | POA: Diagnosis not present

## 2021-06-02 DIAGNOSIS — M7522 Bicipital tendinitis, left shoulder: Secondary | ICD-10-CM

## 2021-06-02 DIAGNOSIS — J00 Acute nasopharyngitis [common cold]: Secondary | ICD-10-CM

## 2021-06-02 MED ORDER — FLUTICASONE PROPIONATE 50 MCG/ACT NA SUSP: 2.0000 | Freq: Every day | NASAL | 6 refills | Status: DC

## 2021-06-02 NOTE — Assessment & Plan Note (Signed)
Continue Xyzal, adding Flonase. Further management per primary care provider.

## 2021-06-02 NOTE — Assessment & Plan Note (Signed)
Kyle Duncan returns, he is a pleasant 45 year old male, we diagnosed him with distal biceps tendinitis at the last visit, he continues to have discomfort in spite of conservative treatment, no improvement whatsoever, distal biceps injection today, avoid supination bicep curls, minimize lifting over the next 4 to 5 days. Return to see me in a month.

## 2021-06-02 NOTE — Assessment & Plan Note (Signed)
MRI and symptomatology consistent with right L5 radiculopathy, he had significant EHL weakness on the right. I did refer him to Dr. Yevette Edwards and set him up for an epidural, never saw Dr. Yevette Edwards, never had the epidural, symptoms have improved considerably, back feels a lot better, his EHL strength is improving as well.

## 2021-06-02 NOTE — Progress Notes (Signed)
    Procedures performed today:    Procedure: Real-time Ultrasound Guided injection of the right distal bicep Device: Samsung HS60  Verbal informed consent obtained.  Time-out conducted.  Noted no overlying erythema, induration, or other signs of local infection.  Skin prepped in a sterile fashion.  Local anesthesia: Topical Ethyl chloride.  With sterile technique and under real time ultrasound guidance: Noted normal-appearing distal biceps, using a dorsal approach 1 cc Kenalog 40, 1 cc lidocaine, 1 cc bupivacaine injected easily Completed without difficulty  Advised to call if fevers/chills, erythema, induration, drainage, or persistent bleeding.  Images permanently stored and available for review in PACS.  Impression: Technically successful ultrasound guided injection.  Independent interpretation of notes and tests performed by another provider:   None.  Brief History, Exam, Impression, and Recommendations:    Biceps tendinitis, left, distal Kyle Duncan, he is a pleasant 45 year old male, we diagnosed him with distal biceps tendinitis at the last visit, he continues to have discomfort in spite of conservative treatment, no improvement whatsoever, distal biceps injection today, avoid supination bicep curls, minimize lifting over the next 4 to 5 days. Return to see me in a month.  Right lumbar radiculopathy MRI and symptomatology consistent with right L5 radiculopathy, he had significant EHL weakness on the right. I did refer him to Dr. Yevette Edwards and set him up for an epidural, never saw Dr. Yevette Edwards, never had the epidural, symptoms have improved considerably, back feels a lot better, his EHL strength is improving as well.  Acute irritant rhinitis Continue Xyzal, adding Flonase. Further management per primary care provider.    ___________________________________________ Kyle Duncan. Kyle Duncan, M.D., ABFM., CAQSM. Primary Care and Sports Medicine [facility]  [facility]  Adjunct Instructor of Family Medicine  [employer]

## 2021-06-24 ENCOUNTER — Institutional Professional Consult (permissible substitution): Payer: No Typology Code available for payment source | Admitting: Neurology

## 2021-07-14 ENCOUNTER — Ambulatory Visit: Payer: No Typology Code available for payment source | Admitting: Sports Medicine

## 2021-07-14 ENCOUNTER — Other Ambulatory Visit: Payer: Self-pay

## 2021-07-14 DIAGNOSIS — M7522 Bicipital tendinitis, left shoulder: Secondary | ICD-10-CM | POA: Diagnosis not present

## 2021-07-14 DIAGNOSIS — J00 Acute nasopharyngitis [common cold]: Secondary | ICD-10-CM | POA: Diagnosis not present

## 2021-07-14 NOTE — Progress Notes (Signed)
    Procedures performed today:    None.  Independent interpretation of notes and tests performed by another provider:   None.  Brief History, Exam, Impression, and Recommendations:    Biceps tendinitis, left, distal This is a very pleasant 45 year old male, diagnosed him previously with distal biceps tendinitis, after failure of conservative treatment we did a distal biceps injection with ultrasound guidance, symptoms are resolved, return as needed.  Acute irritant rhinitis Resolved with the addition of Flonase. Continue Xyzal.   Return as needed.    ___________________________________________ Ihor Austin. Benjamin Stain, M.D., ABFM., CAQSM. Primary Care and Sports Medicine [facility]  Adjunct Instructor of Family Medicine  [employer]

## 2021-07-14 NOTE — Assessment & Plan Note (Signed)
This is a very pleasant 45 year old male, diagnosed him previously with distal biceps tendinitis, after failure of conservative treatment we did a distal biceps injection with ultrasound guidance, symptoms are resolved, return as needed.

## 2021-07-14 NOTE — Assessment & Plan Note (Signed)
Resolved with the addition of Flonase. Continue Xyzal.   Return as needed.

## 2021-08-31 ENCOUNTER — Ambulatory Visit: Payer: No Typology Code available for payment source | Admitting: Sports Medicine

## 2021-09-01 ENCOUNTER — Encounter: Payer: Self-pay | Admitting: Sports Medicine

## 2021-09-01 ENCOUNTER — Ambulatory Visit (INDEPENDENT_AMBULATORY_CARE_PROVIDER_SITE_OTHER): Payer: No Typology Code available for payment source

## 2021-09-01 ENCOUNTER — Other Ambulatory Visit: Payer: Self-pay

## 2021-09-01 ENCOUNTER — Ambulatory Visit: Payer: No Typology Code available for payment source | Admitting: Sports Medicine

## 2021-09-01 DIAGNOSIS — M25572 Pain in left ankle and joints of left foot: Secondary | ICD-10-CM | POA: Diagnosis not present

## 2021-09-01 DIAGNOSIS — G8929 Other chronic pain: Secondary | ICD-10-CM | POA: Insufficient documentation

## 2021-09-01 NOTE — Assessment & Plan Note (Signed)
This is a very pleasant 46 year old male, he has had several months of pain in his left foot and ankle, localized over the navicular with some radiation to the tibialis posterior, worse with inversion. On exam he has tenderness directly over the navicular prominence. I do think he has a navicular stress injury, he has pes planus we will add bilateral scaphoid pads. I would like him to see Dr. Jordan Likes for custom molded orthotics, x-rays, home conditioning given, return to see me in 3 to 4 weeks, we will place him in a boot if no better.

## 2021-09-01 NOTE — Progress Notes (Signed)
° ° °  Procedures performed today:    None.  Independent interpretation of notes and tests performed by another provider:   None.  Brief History, Exam, Impression, and Recommendations:    Chronic pain of left ankle This is a very pleasant 46 year old male, he has had several months of pain in his left foot and ankle, localized over the navicular with some radiation to the tibialis posterior, worse with inversion. On exam he has tenderness directly over the navicular prominence. I do think he has a navicular stress injury, he has pes planus we will add bilateral scaphoid pads. I would like him to see Dr. Jordan Likes for custom molded orthotics, x-rays, home conditioning given, return to see me in 3 to 4 weeks, we will place him in a boot if no better.    ___________________________________________ Kyle Duncan. Benjamin Stain, M.D., ABFM., CAQSM. Primary Care and Sports Medicine [facility]  Adjunct Instructor of Family Medicine  [employer] [facility] [facility] [employer]

## 2021-09-08 ENCOUNTER — Ambulatory Visit: Payer: No Typology Code available for payment source | Admitting: Family Medicine

## 2021-09-08 ENCOUNTER — Encounter: Payer: Self-pay | Admitting: Family Medicine

## 2021-09-08 DIAGNOSIS — G8929 Other chronic pain: Secondary | ICD-10-CM

## 2021-09-08 DIAGNOSIS — M25572 Pain in left ankle and joints of left foot: Secondary | ICD-10-CM | POA: Diagnosis not present

## 2021-09-08 NOTE — Assessment & Plan Note (Signed)
Has more of a pes planus in each foot as well as a Morton's foot on the left.  Seems to be having irritation of the navicular with posterior tibialis tendinitis. -Counseled on home exercise therapy and supportive care. -Orthotics. -Could consider adding scaphoid pads.

## 2021-09-08 NOTE — Progress Notes (Signed)
°  Kyle Duncan - 46 y.o. male MRN [id number]  Date of birth: March 15, 1976  SUBJECTIVE:  Including CC & ROS.  No chief complaint on file.   Kyle Duncan is a 46 y.o. male that is here for foot pain.  Pain is worse when he is walking.  Independent review of the left foot and ankle x-ray from 1/10 shows mild degenerative changes.   Review of Systems See HPI   HISTORY: Past Medical, Surgical, Social, and Family History Reviewed & Updated per EMR.   Pertinent Historical Findings include:  Past Medical History:  Diagnosis Date   H/O: vasectomy 06/09/2016   01/2013   PSVT (paroxysmal supraventricular tachycardia) (HCC)     History reviewed. No pertinent surgical history.   PHYSICAL EXAM:  VS: Ht 5\' 10"  (1.778 m)    Wt 293 lb (132.9 kg)    BMI 42.04 kg/m  Physical Exam Gen: NAD, alert, cooperative with exam, well-appearing MSK:  Neurovascularly intact    Patient was fitted for a standard, cushioned, semi-rigid orthotic. The orthotic was heated and afterward the patient stood on the orthotic blank positioned on the orthotic stand. The patient was positioned in subtalar neutral position and 10 degrees of ankle dorsiflexion in a weight bearing stance. After completion of molding, a stable base was applied to the orthotic blank. The blank was ground to a stable position for weight bearing. Size: 14 Pairs: 2 Base: cork Additional Posting and Padding: None The patient ambulated these, and they were very comfortable.    ASSESSMENT & PLAN:   Chronic pain of left ankle Has more of a pes planus in each foot as well as a Morton's foot on the left.  Seems to be having irritation of the navicular with posterior tibialis tendinitis. -Counseled on home exercise therapy and supportive care. -Orthotics. -Could consider adding scaphoid pads.

## 2021-09-29 ENCOUNTER — Ambulatory Visit: Payer: No Typology Code available for payment source | Admitting: Sports Medicine

## 2021-11-06 ENCOUNTER — Other Ambulatory Visit: Payer: Self-pay

## 2021-11-06 ENCOUNTER — Ambulatory Visit: Payer: No Typology Code available for payment source | Admitting: Sports Medicine

## 2021-11-06 DIAGNOSIS — M25572 Pain in left ankle and joints of left foot: Secondary | ICD-10-CM | POA: Diagnosis not present

## 2021-11-06 DIAGNOSIS — G8929 Other chronic pain: Secondary | ICD-10-CM

## 2021-11-06 NOTE — Progress Notes (Signed)
? ? ?  Procedures performed today:   ? ?None. ? ?Independent interpretation of notes and tests performed by another provider:  ? ?None. ? ?Brief History, Exam, Impression, and Recommendations:   ? ?Chronic pain of left ankle ?Tibialis posterior tendinitis, pain at the navicular. ?This has resolved with tibialis posterior conditioning and custom molded orthotics. ?He is having some metatarsalgia in the orthotic so we added a metatarsal bar, return to see me in 2 weeks as needed. ? ? ?___________________________________________ ?Gwen Her. Dianah Field, M.D., ABFM., CAQSM. ?Primary Care and Sports Medicine ?[place] ? ?Adjunct Instructor of Family Medicine  ?[employer] ?

## 2021-11-06 NOTE — Assessment & Plan Note (Signed)
Tibialis posterior tendinitis, pain at the navicular. ?This has resolved with tibialis posterior conditioning and custom molded orthotics. ?He is having some metatarsalgia in the orthotic so we added a metatarsal bar, return to see me in 2 weeks as needed. ?

## 2021-11-20 ENCOUNTER — Ambulatory Visit: Payer: No Typology Code available for payment source | Admitting: Sports Medicine

## 2021-12-02 ENCOUNTER — Ambulatory Visit: Payer: No Typology Code available for payment source | Admitting: Sports Medicine

## 2021-12-02 DIAGNOSIS — M25572 Pain in left ankle and joints of left foot: Secondary | ICD-10-CM

## 2021-12-02 DIAGNOSIS — G8929 Other chronic pain: Secondary | ICD-10-CM | POA: Diagnosis not present

## 2021-12-02 NOTE — Progress Notes (Signed)
? ? ?  Procedures performed today:   ? ?None. ? ?Independent interpretation of notes and tests performed by another provider:  ? ?None. ? ?Brief History, Exam, Impression, and Recommendations:   ? ?Chronic pain of left ankle ?Kyle Duncan returns, he is a very pleasant 46 year old male aircraft cannot, initially we diagnosed tibialis posterior tendinitis with some pain at the navicular as well that resolved with custom orthotics and tibialis posterior conditioning, at the last visit we noted some metatarsalgia so we added a metatarsal bar, his metatarsalgia has resolved and the metatarsal bar simply uncomfortable in the orthotic now so this was removed, he can place another one if needed but otherwise return to see me as needed. ? ? ? ?___________________________________________ ?Ihor Austin. Benjamin Stain, M.D., ABFM., CAQSM. ?Primary Care and Sports Medicine ? [facility] ? ?Adjunct Instructor of Family Medicine  ?[employer] ?

## 2021-12-02 NOTE — Assessment & Plan Note (Signed)
Kyle Duncan returns, he is a very pleasant 46 year old male aircraft cannot, initially we diagnosed tibialis posterior tendinitis with some pain at the navicular as well that resolved with custom orthotics and tibialis posterior conditioning, at the last visit we noted some metatarsalgia so we added a metatarsal bar, his metatarsalgia has resolved and the metatarsal bar simply uncomfortable in the orthotic now so this was removed, he can place another one if needed but otherwise return to see me as needed. ?

## 2021-12-27 NOTE — Progress Notes (Signed)
HPI: ?Kyle Duncan is a 46 y.o. male who  has a past medical history of H/O: vasectomy (06/09/2016) and PSVT (paroxysmal supraventricular tachycardia) ([place]). ? ?he presents to [facility] today, 12/28/21,  ?for chief complaint of: ?Annual physical exam ? ?Dentist: UTD ?Eye exam: UTD ?Exercise: gym, treadmill, walking, weights ?Diet: working on protein rich diet,  travels a lot ?Colon cancer screening: Cologuard last year ?Prostate cancer screening: done with [facility], normal ? ?Concerns: ?Weight concerns- recently started testosterone supplements to help with this at [facility].  ? ?Past medical, surgical, social and family history reviewed: ? ?Patient Active Problem List  ? Diagnosis Date Noted  ? Chronic pain of left ankle 09/01/2021  ? Acute irritant rhinitis 06/02/2021  ? Right lumbar radiculopathy 05/05/2021  ? Biceps tendinitis, left, distal 05/05/2021  ? Abnormal weight gain 01/13/2021  ? De Quervain's tenosynovitis, right 12/01/2020  ? Erectile dysfunction 12/01/2020  ? Cubital tunnel syndrome of both upper extremities 09/07/2017  ? Tennis elbow 09/07/2017  ? H/O: vasectomy 06/09/2016  ? Decreased testosterone level 06/03/2016  ? History of PSVT (paroxysmal supraventricular tachycardia) 05/31/2016  ? Bradycardia 05/31/2016  ? Fatigue 05/31/2016  ? Decreased libido 05/31/2016  ? Weight gain 05/31/2016  ? ? ?History reviewed. No pertinent surgical history. ? ?Social History  ? ?Tobacco Use  ? Smoking status: Never  ? Smokeless tobacco: Never  ?Substance Use Topics  ? Alcohol use: Not on file  ? ? ?Family History  ?Problem Relation Age of Onset  ? Cancer Father   ? Diabetes Maternal Grandmother   ? Hyperlipidemia Maternal Grandmother   ? Hypertension Maternal Grandmother   ? Breast cancer Maternal Grandmother   ? Heart attack Paternal Grandmother   ? Stroke Paternal Grandfather   ? Hypertension Mother   ? Diabetes Mother   ? Colon cancer Maternal Aunt   ? Stroke Paternal  Uncle   ? Hypertension Maternal Grandfather   ? Heart disease Maternal Grandfather   ? Skin cancer Maternal Grandfather   ?  ? ?Current medication list and allergy/intolerance information reviewed:   ? ?Current Outpatient Medications  ?Medication Sig Dispense Refill  ? ipratropium (ATROVENT) 0.03 % nasal spray Place 2 sprays into both nostrils every 12 (twelve) hours. 30 mL 0  ? Multiple Vitamin (MULTIVITAMIN WITH MINERALS) TABS tablet Take 1 tablet by mouth daily.    ? tadalafil (CIALIS) 10 MG tablet Take 1 tablet (10 mg total) by mouth daily. 30 tablet 11  ? Whey Protein POWD Take by mouth.    ? ?No current facility-administered medications for this visit.  ? ? ?No Known Allergies ?  ? ?Review of Systems: ?Constitutional:  No  fever, no chills, No recent illness, No unintentional weight changes. No significant fatigue.  ?HEENT: No  headache, no vision change, no hearing change, No sore throat, No  sinus pressure ?Cardiac: No  chest pain, No  pressure, No palpitations, No  Orthopnea ?Respiratory:  No  shortness of breath. No  Cough ?Gastrointestinal: No  abdominal pain, No  nausea, No  vomiting,  No  blood in stool, No  diarrhea, No  constipation  ?Musculoskeletal: No new myalgia/arthralgia ?Skin: No  Rash, No other wounds/concerning lesions ?Genitourinary: No  incontinence, No  abnormal genital bleeding, No abnormal genital discharge ?Hem/Onc: No  easy bruising/bleeding, No  abnormal lymph node ?Endocrine: No cold intolerance,  No heat intolerance. No polyuria/polydipsia/polyphagia  ?Neurologic: No  weakness, No  dizziness, No  slurred speech/focal weakness/facial droop ?Psychiatric:  No  concerns with depression, No  concerns with anxiety, No sleep problems, No mood problems ? ?Exam:  ?BP 123/77   Pulse 69   Resp 20   Ht 5\' 10"  (1.778 m)   Wt (!) 308 lb 6.4 oz (139.9 kg)   SpO2 97%   BMI 44.25 kg/m?  ?Constitutional: VS see above. General Appearance: alert, well-developed, well-nourished, NAD ?Eyes: Normal  lids and conjunctive, non-icteric sclera ?Ears, Nose, Mouth, Throat: MMM, Normal external inspection ears/nares/mouth/lips/gums. TM normal bilaterally. Pharynx/tonsils no erythema, no exudate. Nasal mucosa normal.  ?Neck: No masses, trachea midline. No thyroid enlargement. No tenderness/mass appreciated. No lymphadenopathy ?Respiratory: Normal respiratory effort. no wheeze, no rhonchi, no rales ?Cardiovascular: S1/S2 normal, no murmur, no rub/gallop auscultated. RRR. No lower extremity edema. Pedal pulse II/IV bilaterally DP and PT. No carotid bruit or JVD. No abdominal aortic bruit. ?Gastrointestinal: Nontender, no masses. No hepatomegaly, no splenomegaly. No hernia appreciated. Bowel sounds normal. Rectal exam deferred.  ?Musculoskeletal: Gait normal. No clubbing/cyanosis of digits.  ?Neurological: Normal balance/coordination. No tremor. No cranial nerve deficit on limited exam. Motor and sensation intact and symmetric. Cerebellar reflexes intact.  ?Skin: warm, dry, intact. No rash/ulcer. No concerning nevi or subq nodules on limited exam.   ?Psychiatric: Normal judgment/insight. Normal mood and affect. Oriented x3.  ? ? ?ASSESSMENT/PLAN:  ? ?1. Annual physical exam ?Checking labs today. UTD on preventative care. Requested patient have recent labs with [facility] sent to our office for astract into his chart. Wellness information provided with AVS.  ?- Lipid panel ?- COMPLETE METABOLIC PANEL WITH GFR ?- CBC with Differential/Platelet ?- POCT HgB A1C ? ?2. Prostate cancer screening ?Deferring PSA today as this was checked at [facility].  ? ?3. Weight gain ?Discussed BMR calculations, calorie deficit, and recommended macro goals for weight loss versus muscle gain. Suspect he is not eating enough rather than the opposite. Recommend looking into macro counting and seeking out options for meal prep that will meet nutritional goals. Continue regular intentional exercise with a focus on weight training but incorporating cardio  activities several times weekly.  ? ?Orders Placed This Encounter  ?Procedures  ? Lipid panel  ? COMPLETE METABOLIC PANEL WITH GFR  ? CBC with Differential/Platelet  ? POCT HgB A1C  ? ? ?No orders of the defined types were placed in this encounter. ? ?Follow-up plan: Return in about 1 year (around 12/29/2022) for annual physical exam or sooner if needed. ? ?Clearnce Sorrel, DNP, APRN, FNP-BC ?[place] ?Primary Care and Sports Medicine ?

## 2021-12-28 ENCOUNTER — Ambulatory Visit (INDEPENDENT_AMBULATORY_CARE_PROVIDER_SITE_OTHER): Payer: No Typology Code available for payment source | Admitting: Medical-Surgical

## 2021-12-28 ENCOUNTER — Encounter: Payer: Self-pay | Admitting: Medical-Surgical

## 2021-12-28 VITALS — BP 123/77 | HR 69 | Resp 20 | Ht 70.0 in | Wt 308.4 lb

## 2021-12-28 DIAGNOSIS — R635 Abnormal weight gain: Secondary | ICD-10-CM | POA: Diagnosis not present

## 2021-12-28 DIAGNOSIS — Z125 Encounter for screening for malignant neoplasm of prostate: Secondary | ICD-10-CM

## 2021-12-28 DIAGNOSIS — Z0000 Encounter for general adult medical examination without abnormal findings: Secondary | ICD-10-CM | POA: Diagnosis not present

## 2021-12-28 LAB — POCT GLYCOSYLATED HEMOGLOBIN (HGB A1C): HbA1c POC (<> result, manual entry): 5.3 % (ref 4.0–5.6)

## 2021-12-29 ENCOUNTER — Encounter: Payer: Self-pay | Admitting: Medical-Surgical

## 2021-12-29 LAB — CBC WITH DIFFERENTIAL/PLATELET
Absolute Monocytes: 559 cells/uL (ref 200–950)
Basophils Absolute: 83 cells/uL (ref 0–200)
Basophils Relative: 1.2 %
Eosinophils Absolute: 242 cells/uL (ref 15–500)
Eosinophils Relative: 3.5 %
HCT: 44.8 % (ref 38.5–50.0)
Hemoglobin: 14.8 g/dL (ref 13.2–17.1)
Lymphs Abs: 2429 cells/uL (ref 850–3900)
MCH: 31.7 pg (ref 27.0–33.0)
MCHC: 33 g/dL (ref 32.0–36.0)
MCV: 95.9 fL (ref 80.0–100.0)
MPV: 10.9 fL (ref 7.5–12.5)
Monocytes Relative: 8.1 %
Neutro Abs: 3588 cells/uL (ref 1500–7800)
Neutrophils Relative %: 52 %
Platelets: 268 10*3/uL (ref 140–400)
RBC: 4.67 10*6/uL (ref 4.20–5.80)
RDW: 12.3 % (ref 11.0–15.0)
Total Lymphocyte: 35.2 %
WBC: 6.9 10*3/uL (ref 3.8–10.8)

## 2021-12-29 LAB — LIPID PANEL
Cholesterol: 178 mg/dL (ref <200)
HDL: 71 mg/dL (ref >=40)
LDL Cholesterol (Calc): 79 mg/dL
Non-HDL Cholesterol (Calc): 107 mg/dL (ref <130)
Total CHOL/HDL Ratio: 2.5 (calc) (ref <5.0)
Triglycerides: 187 mg/dL — ABNORMAL HIGH (ref <150)

## 2021-12-29 LAB — COMPLETE METABOLIC PANEL WITHOUT GFR
AG Ratio: 2 (calc) (ref 1.0–2.5)
ALT: 28 U/L (ref 9–46)
AST: 21 U/L (ref 10–40)
Albumin: 4.3 g/dL (ref 3.6–5.1)
Alkaline phosphatase (APISO): 54 U/L (ref 36–130)
BUN: 21 mg/dL (ref 7–25)
CO2: 29 mmol/L (ref 20–32)
Calcium: 9.3 mg/dL (ref 8.6–10.3)
Chloride: 101 mmol/L (ref 98–110)
Creat: 0.97 mg/dL (ref 0.60–1.29)
Globulin: 2.1 g/dL (ref 1.9–3.7)
Glucose, Bld: 87 mg/dL (ref 65–139)
Potassium: 4.2 mmol/L (ref 3.5–5.3)
Sodium: 139 mmol/L (ref 135–146)
Total Bilirubin: 0.4 mg/dL (ref 0.2–1.2)
Total Protein: 6.4 g/dL (ref 6.1–8.1)
eGFR: 97 mL/min/1.73m2 (ref >=60)

## 2022-01-18 ENCOUNTER — Other Ambulatory Visit: Payer: Self-pay | Admitting: Sports Medicine

## 2022-01-18 DIAGNOSIS — N529 Male erectile dysfunction, unspecified: Secondary | ICD-10-CM

## 2022-03-02 ENCOUNTER — Ambulatory Visit: Payer: Managed Care, Other (non HMO) | Admitting: Medical-Surgical

## 2022-03-02 ENCOUNTER — Encounter: Payer: Self-pay | Admitting: Medical-Surgical

## 2022-03-02 VITALS — BP 134/79 | HR 65 | Resp 20 | Ht 70.0 in | Wt 318.1 lb

## 2022-03-02 DIAGNOSIS — G4719 Other hypersomnia: Secondary | ICD-10-CM

## 2022-03-02 NOTE — Progress Notes (Signed)
   Established Patient Office Visit  Subjective   Patient ID: Kyle Duncan, male   DOB: 1976-07-01 Age: 46 y.o. MRN: [id number]   Chief Complaint  Patient presents with   Follow-up    HPI Pleasant 46 year old male presenting today to discuss excessive daytime sleepiness and fatigue.  Was diagnosed with obstructive sleep apnea years ago and did use a CPAP for a time but it has been many years since his last use.  Presently, he reports being able to sleep all night yet still feeling significantly fatigued when he wakes up in the morning.  STOP-BANG for SLEEP APNEA Do you Snore loudly? Yes Do you often feel Tired during day? Yes Has anyone Observed you stop breathing? Yes History of high blood Pressure? No BMI >35? Yes Age >50? No Neck circumference >16 in? Yes Gender male? Yes 5-8 = high risk 3-4 = intermediate 0-2 = low risk    Objective:    Vitals:   03/02/22 1505  BP: 134/79  Pulse: 65  Resp: 20  Height: 5\' 10"  (1.778 m)  Weight: (!) 318 lb 2.4 oz (144.3 kg)  SpO2: 97%  BMI (Calculated): 45.65   Physical Exam Vitals reviewed.  Constitutional:      General: He is not in acute distress.    Appearance: Normal appearance. He is obese. He is not ill-appearing.  HENT:     Head: Normocephalic.  Cardiovascular:     Rate and Rhythm: Normal rate and regular rhythm.  Pulmonary:     Effort: Pulmonary effort is normal. No respiratory distress.  Skin:    General: Skin is warm and dry.  Neurological:     Mental Status: He is alert and oriented to person, place, and time.  Psychiatric:        Mood and Affect: Mood normal.        Behavior: Behavior normal.        Thought Content: Thought content normal.        Judgment: Judgment normal.   No results found for this or any previous visit (from the past 24 hour(s)).     The 10-year ASCVD risk score (Arnett DK, et al., 2019) is: 1.5%   Values used to calculate the score:     Age: 66 years     Sex: Male     Is  Non-Hispanic African American: No     Diabetic: No     Tobacco smoker: No     Systolic Blood Pressure: 134 mmHg     Is BP treated: No     HDL Cholesterol: 71 mg/dL     Total Cholesterol: 178 mg/dL   Assessment & Plan:   1. Excessive daytime sleepiness Known history of sleep apnea diagnosed greater than 10 years ago with no updated sleep study.  Would like to get a split-night sleep study for reevaluation of severity of symptoms as well as getting set up with a CPAP and mask that will work for him. - Split night study  Return if symptoms worsen or fail to improve. ___________________________________________ 56, DNP, APRN, FNP-BC Primary Care and Sports Medicine [facility]

## 2022-03-12 ENCOUNTER — Other Ambulatory Visit: Payer: Self-pay | Admitting: Sports Medicine

## 2022-03-12 DIAGNOSIS — N529 Male erectile dysfunction, unspecified: Secondary | ICD-10-CM

## 2022-03-13 ENCOUNTER — Other Ambulatory Visit: Payer: Self-pay | Admitting: Sports Medicine

## 2022-03-13 DIAGNOSIS — N529 Male erectile dysfunction, unspecified: Secondary | ICD-10-CM

## 2022-03-15 MED ORDER — TADALAFIL 10 MG PO TABS: 10.0000 mg | ORAL_TABLET | Freq: Every day | ORAL | 0 refills | Status: DC

## 2022-04-01 ENCOUNTER — Encounter: Payer: Self-pay | Admitting: Medical-Surgical

## 2022-04-01 DIAGNOSIS — G4719 Other hypersomnia: Secondary | ICD-10-CM

## 2022-05-11 ENCOUNTER — Ambulatory Visit (HOSPITAL_BASED_OUTPATIENT_CLINIC_OR_DEPARTMENT_OTHER): Payer: Managed Care, Other (non HMO) | Admitting: Internal Medicine

## 2022-05-14 ENCOUNTER — Ambulatory Visit (HOSPITAL_BASED_OUTPATIENT_CLINIC_OR_DEPARTMENT_OTHER): Payer: Managed Care, Other (non HMO) | Attending: Medical-Surgical | Admitting: Internal Medicine

## 2022-05-14 VITALS — Ht 70.0 in | Wt 300.0 lb

## 2022-05-14 DIAGNOSIS — G4719 Other hypersomnia: Secondary | ICD-10-CM | POA: Diagnosis not present

## 2022-05-14 DIAGNOSIS — G4733 Obstructive sleep apnea (adult) (pediatric): Secondary | ICD-10-CM | POA: Diagnosis not present

## 2022-05-23 DIAGNOSIS — G4719 Other hypersomnia: Secondary | ICD-10-CM

## 2022-05-23 NOTE — Procedures (Signed)
Patient Name: Kyle Duncan, Kyle Duncan Date: 05/14/2022 Gender: Male D.O.B: [birth year] Age (years): 60 Referring Provider: Samuel Bouche NP Height (inches): 61 Interpreting Physician: Baird Lyons MD, ABSM Weight (lbs): 300 RPSGT: Baxter Flattery BMI: 43 MRN: [id number] Neck Size: 21.00  CLINICAL INFORMATION Sleep Study Type: Split Night CPAP Indication for sleep study: Fatigue, Obesity, OSA, Snoring, Witnessed Apneas Epworth Sleepiness Score: 14  SLEEP STUDY TECHNIQUE As per the AASM Manual for the Scoring of Sleep and Associated Events v2.3 (April 2016) with a hypopnea requiring 4% desaturations.  The channels recorded and monitored were frontal, central and occipital EEG, electrooculogram (EOG), submentalis EMG (chin), nasal and oral airflow, thoracic and abdominal wall motion, anterior tibialis EMG, snore microphone, electrocardiogram, and pulse oximetry. Continuous positive airway pressure (CPAP) was initiated when the patient met split night criteria and was titrated according to treat sleep-disordered breathing.  MEDICATIONS Medications self-administered by patient taken the night of the study : none reported  RESPIRATORY PARAMETERS Diagnostic  Total AHI (/hr): 58.0 RDI (/hr): 58.4 OA Index (/hr): 11.5 CA Index (/hr): 0.0 REM AHI (/hr): 50.3 NREM AHI (/hr): 58.9 Supine AHI (/hr): 66.2 Non-supine AHI (/hr): 49.3 Min O2 Sat (%): 79.0 Mean O2 (%): 91.8 Time below 88% (min): 29.6   Titration  Optimal Pressure (cm): 14 AHI at Optimal Pressure (/hr): 0 Min O2 at Optimal Pressure (%): 93.0 Supine % at Optimal (%): 90 Sleep % at Optimal (%): 100   SLEEP ARCHITECTURE The recording time for the entire night was 363.1 minutes.  During a baseline period of 157.5 minutes, the patient slept for 151.0 minutes in REM and nonREM, yielding a sleep efficiency of 95.9%%. Sleep onset after lights out was 2.5 minutes with a REM latency of 133.0 minutes. The patient spent 5.0%% of the  night in stage N1 sleep, 84.8%% in stage N2 sleep, 0.0%% in stage N3 and 10.3% in REM.  During the titration period of 203.4 minutes, the patient slept for 199.1 minutes in REM and nonREM, yielding a sleep efficiency of 97.9%%. Sleep onset after CPAP initiation was 2.8 minutes with a REM latency of 56.0 minutes. The patient spent 2.3%% of the night in stage N1 sleep, 62.8%% in stage N2 sleep, 0.0%% in stage N3 and 34.9% in REM.  CARDIAC DATA The 2 lead EKG demonstrated sinus rhythm. The mean heart rate was 100.0 beats per minute. Other EKG findings include: None.  LEG MOVEMENT DATA The total Periodic Limb Movements of Sleep (PLMS) were 0. The PLMS index was 0.0 .  IMPRESSIONS - Severe obstructive sleep apnea occurred during the diagnostic portion of the study (AHI = 58.0/hour). An optimal PAP pressure was selected for this patient ( 14 cm of water) - No significant central sleep apnea occurred during the diagnostic portion of the study (CAI = 0.0/hour). - Moderate oxygen desaturation was noted during the diagnostic portion of the study (Min O2 =79.0%).  Minimum O2 saturation on CPAP 14 was 93%. - No snoring was audible during the diagnostic portion of the study. - No cardiac abnormalities were noted during this study. - Clinically significant periodic limb movements did not occur during sleep.  DIAGNOSIS - Obstructive Sleep Apnea (G47.33)  RECOMMENDATIONS - Trial of CPAP therapy on 14 cm H2O or autopap 10-20. - Patient used a Large size Fisher&Paykel Full Face Simplus mask and heated humidification. - Be careful with alcohol, sedatives and other CNS depressants that may worsen sleep apnea and disrupt normal sleep architecture. - Sleep hygiene should be reviewed  to assess factors that may improve sleep quality. - Weight management and regular exercise should be initiated or continued.  [Electronically signed] 05/23/2022 12:00 PM  Baird Lyons MD, [place], American Board of Sleep  Medicine NPI: [id number]                        [place], [place] of Sleep Medicine  ELECTRONICALLY SIGNED ON:  05/23/2022, 11:58 AM [place] PH: [phone]   FX: [phone] [place]

## 2022-05-31 ENCOUNTER — Encounter: Payer: Self-pay | Admitting: Medical-Surgical

## 2022-06-02 ENCOUNTER — Other Ambulatory Visit: Payer: Self-pay

## 2022-06-02 DIAGNOSIS — G4719 Other hypersomnia: Secondary | ICD-10-CM

## 2022-06-02 MED ORDER — AMBULATORY NON FORMULARY MEDICATION: 0 refills | Status: DC

## 2022-06-02 MED ORDER — AMBULATORY NON FORMULARY MEDICATION: 0 refills | Status: AC

## 2022-06-21 ENCOUNTER — Other Ambulatory Visit: Payer: Self-pay | Admitting: Medical-Surgical

## 2022-06-21 DIAGNOSIS — N529 Male erectile dysfunction, unspecified: Secondary | ICD-10-CM

## 2022-06-24 ENCOUNTER — Other Ambulatory Visit: Payer: Self-pay | Admitting: Medical-Surgical

## 2022-06-24 DIAGNOSIS — N529 Male erectile dysfunction, unspecified: Secondary | ICD-10-CM

## 2022-07-07 ENCOUNTER — Encounter: Payer: Self-pay | Admitting: Medical-Surgical

## 2022-07-07 ENCOUNTER — Ambulatory Visit (INDEPENDENT_AMBULATORY_CARE_PROVIDER_SITE_OTHER): Payer: Managed Care, Other (non HMO) | Admitting: Medical-Surgical

## 2022-07-07 DIAGNOSIS — G4733 Obstructive sleep apnea (adult) (pediatric): Secondary | ICD-10-CM

## 2022-07-07 NOTE — Progress Notes (Signed)
Faxed medical record request for CPAP Compliance Report to [employer] #[phone]

## 2022-07-07 NOTE — Progress Notes (Signed)
Virtual Visit via Video Note  I connected with Kyle Duncan on 07/07/22 at  9:50 AM EST by a video enabled telemedicine application and verified that I am speaking with the correct person using two identifiers.   I discussed the limitations of evaluation and management by telemedicine and the availability of in person appointments. The patient expressed understanding and agreed to proceed.  Patient location: home Provider locations: office  Subjective:    CC: CPAP follow-up  HPI: Pleasant 46 year old male presenting today via MyChart video visit for follow-up on sleep apnea with CPAP use.  He has been using his CPAP for approximately 1 month.  Notes that he is trying to get used to sleeping with the mask on his face.  Unfortunately, the nasal mask did not work for him and he has to use a fullface mask.  Has been using the CPAP most nights although last night he had trouble sleeping and did not wear it as prescribed.  Most nights he aims to get no less than 4 hours of use for it.  Has started to notice a gradual improvement in his sleep quality and overall energy level but nothing significant yet.  Has not been checking his blood pressure recently.  Has been making some changes in his everyday activity to help with weight loss.  He is exercising regularly and has switched to a high-protein diet.  Past medical history, Surgical history, Family history not pertinant except as noted below, Social history, Allergies, and medications have been entered into the medical record, reviewed, and corrections made.   Review of Systems: See HPI for pertinent positives and negatives.   Objective:    General: Speaking clearly in complete sentences without any shortness of breath.  Alert and oriented x3.  Normal judgment. No apparent acute distress.  Impression and Recommendations:    1. OSA (obstructive sleep apnea) Per patient report, he has been using the CPAP reliably and is working on getting  used to it.  We will request his compliance report from [facility] for upload into his chart.  Recommend continuing to use his CPAP and monitoring blood pressure intermittently for improvements.  Continue exercising regularly with a goal for weight loss.  I discussed the assessment and treatment plan with the patient. The patient was provided an opportunity to ask questions and all were answered. The patient agreed with the plan and demonstrated an understanding of the instructions.   The patient was advised to call back or seek an in-person evaluation if the symptoms worsen or if the condition fails to improve as anticipated.  20 minutes of non-face-to-face time was provided during this encounter.  Return for Annual follow-up when due.  Thayer Ohm, DNP, APRN, FNP-BC [facility]

## 2022-07-21 ENCOUNTER — Ambulatory Visit
Admission: EM | Admit: 2022-07-21 | Discharge: 2022-07-21 | Disposition: A | Discharge status: Home / Self Care | Payer: Managed Care, Other (non HMO) | Attending: Family Medicine | Admitting: Family Medicine

## 2022-07-21 ENCOUNTER — Telehealth: Payer: Self-pay | Admitting: Family Medicine

## 2022-07-21 DIAGNOSIS — J111 Influenza due to unidentified influenza virus with other respiratory manifestations: Secondary | ICD-10-CM

## 2022-07-21 DIAGNOSIS — U071 COVID-19: Secondary | ICD-10-CM | POA: Diagnosis present

## 2022-07-21 LAB — RESP PANEL BY RT-PCR (FLU A&B, COVID) ARPGX2
Influenza A by PCR: NEGATIVE
Influenza B by PCR: NEGATIVE
SARS Coronavirus 2 by RT PCR: POSITIVE — AB

## 2022-07-21 MED ORDER — PAXLOVID (300/100) 20 X 150 MG & 10 X 100MG PO TBPK: ORAL_TABLET | ORAL | 0 refills | Status: DC

## 2022-07-21 NOTE — ED Provider Notes (Signed)
[facility]    CSN: [id number] Arrival date & time: 07/21/22  1165      History   Chief Complaint Chief Complaint  Patient presents with   Generalized Body Aches   Facial Pain    Post nasal drainage    HPI Kyle Duncan is a 46 y.o. male.   HPI  Patient feels like he is coming down with something.  Since yesterday he has had body aches, fatigue, coughing and congestion.  Also some runny nose and postnasal drip.  Very tired.  Sudden onset "like the flu"  Past Medical History:  Diagnosis Date   H/O: vasectomy 06/09/2016   01/2013   PSVT (paroxysmal supraventricular tachycardia)     Patient Active Problem List   Diagnosis Date Noted   Chronic pain of left ankle 09/01/2021   Acute irritant rhinitis 06/02/2021   Right lumbar radiculopathy 05/05/2021   Biceps tendinitis, left, distal 05/05/2021   Abnormal weight gain 01/13/2021   De Quervain's tenosynovitis, right 12/01/2020   Erectile dysfunction 12/01/2020   Cubital tunnel syndrome of both upper extremities 09/07/2017   Tennis elbow 09/07/2017   H/O: vasectomy 06/09/2016   Decreased testosterone level 06/03/2016   History of PSVT (paroxysmal supraventricular tachycardia) 05/31/2016   Bradycardia 05/31/2016   Fatigue 05/31/2016   Decreased libido 05/31/2016   Weight gain 05/31/2016    History reviewed. No pertinent surgical history.     Home Medications    Prior to Admission medications   Medication Sig Start Date End Date Taking? Authorizing Provider  AMBULATORY NON FORMULARY MEDICATION Continuous positive airway pressure (CPAP) machine set on AutoPAP (10-20 cmH2O), with all supplemental supplies as needed. Please provide a large size [employer] Full Face Simplus Mask. 06/02/22   Christen Butter, NP  calcium-vitamin D (OSCAL WITH D) 500-5 MG-MCG tablet Take 1 tablet by mouth.    [provider]  MAGNESIUM HYDROXIDE PO Take by mouth.    [provider]  Menaquinone-7 (K2 PO)  Take by mouth.    [provider]  Multiple Vitamin (MULTIVITAMIN WITH MINERALS) TABS tablet Take 1 tablet by mouth daily.    [provider]  tadalafil (CIALIS) 10 MG tablet Take 1 tablet by mouth once daily 06/22/22   Christen Butter, NP  Whey Protein POWD Take by mouth.    [provider]  Zinc Acetate, Oral, (ZINC ACETATE PO) Take by mouth.    [provider]    Family History Family History  Problem Relation Age of Onset   Cancer Father    Diabetes Maternal Grandmother    Hyperlipidemia Maternal Grandmother    Hypertension Maternal Grandmother    Breast cancer Maternal Grandmother    Heart attack Paternal Grandmother    Stroke Paternal Grandfather    Hypertension Mother    Diabetes Mother    Colon cancer Maternal Aunt    Stroke Paternal Uncle    Hypertension Maternal Grandfather    Heart disease Maternal Grandfather    Skin cancer Maternal Grandfather     Social History Social History   Tobacco Use   Smoking status: Never   Smokeless tobacco: Never     Allergies   Patient has no known allergies.   Review of Systems Review of Systems See HPI  Physical Exam Triage Vital Signs ED Triage Vitals  Enc Vitals Group     BP 07/21/22 0845 132/72     Pulse Rate 07/21/22 0845 74     Resp 07/21/22 0845 17  Temp 07/21/22 0845 98.5 F (36.9 C)     Temp Source 07/21/22 0845 Oral     SpO2 07/21/22 0845 98 %     Weight --      Height --      Head Circumference --      Peak Flow --      Pain Score 07/21/22 0846 0     Pain Loc --      Pain Edu? --      Excl. in [place]? --    No data found.  Updated Vital Signs BP 132/72 (BP Location: Right Arm)   Pulse 74   Temp 98.5 F (36.9 C) (Oral)   Resp 17   SpO2 98%      Physical Exam Constitutional:      General: He is not in acute distress.    Appearance: He is well-developed. He is ill-appearing.  HENT:     Head: Normocephalic and atraumatic.     Right Ear: Tympanic membrane  normal.     Left Ear: Tympanic membrane and ear canal normal.     Nose: Congestion present.     Mouth/Throat:     Pharynx: Posterior oropharyngeal erythema present.  Eyes:     Conjunctiva/sclera: Conjunctivae normal.     Pupils: Pupils are equal, round, and reactive to light.  Cardiovascular:     Rate and Rhythm: Normal rate and regular rhythm.     Heart sounds: Normal heart sounds.  Pulmonary:     Effort: Pulmonary effort is normal. No respiratory distress.     Breath sounds: Normal breath sounds.  Abdominal:     General: There is no distension.     Palpations: Abdomen is soft.  Musculoskeletal:        General: Normal range of motion.     Cervical back: Normal range of motion.  Skin:    General: Skin is warm and dry.  Neurological:     Mental Status: He is alert.  Psychiatric:        Mood and Affect: Mood normal.        Behavior: Behavior normal.      UC Treatments / Results  Labs (all labs ordered are listed, but only abnormal results are displayed) Labs Reviewed  RESP PANEL BY RT-PCR (FLU A&B, COVID) [id number]    EKG   Radiology No results found.  Procedures Procedures (including critical care time)  Medications Ordered in UC Medications - No data to display  Initial Impression / Assessment and Plan / UC Course  I have reviewed the triage vital signs and the nursing notes.  Pertinent labs & imaging results that were available during my care of the patient were reviewed by me and considered in my medical decision making (see chart for details).     Final Clinical Impressions(s) / UC Diagnoses   Final diagnoses:  Influenza-like illness     Discharge Instructions      Home to rest Push fluids OTC cough and cold medicine Results will be available this afternoon    ED Prescriptions   None    PDMP not reviewed this encounter.   Eustace Moore, MD 07/21/22 [phone]

## 2022-07-21 NOTE — ED Triage Notes (Signed)
Pt c/o sinus pressure, some post nasal drainage and bodyaches x 2 days. Denies fever. Dayquil and nyquil prn.

## 2022-07-21 NOTE — Telephone Encounter (Signed)
Notified patient that his COVID test is positive Discussed home treatment Discussed quarantine and mask requirement/recommendation Discussed Paxlovid.  Patient desires prescription.  It is sent to his pharmacy He is advised to call for problems

## 2022-07-21 NOTE — Discharge Instructions (Signed)
Home to rest Push fluids OTC cough and cold medicine Results will be available this afternoon

## 2022-07-23 ENCOUNTER — Encounter: Payer: Self-pay | Admitting: Medical-Surgical

## 2022-07-23 NOTE — Telephone Encounter (Signed)
Seen by urgent care and diagnosed there. Discussed with that physician but not documented what exactly was discussed.

## 2022-08-31 ENCOUNTER — Encounter: Payer: Self-pay | Admitting: Medical-Surgical

## 2022-08-31 ENCOUNTER — Telehealth (INDEPENDENT_AMBULATORY_CARE_PROVIDER_SITE_OTHER): Payer: Managed Care, Other (non HMO) | Admitting: Medical-Surgical

## 2022-08-31 DIAGNOSIS — G4719 Other hypersomnia: Secondary | ICD-10-CM

## 2022-08-31 DIAGNOSIS — R12 Heartburn: Secondary | ICD-10-CM

## 2022-08-31 DIAGNOSIS — G4733 Obstructive sleep apnea (adult) (pediatric): Secondary | ICD-10-CM

## 2022-08-31 DIAGNOSIS — Z808 Family history of malignant neoplasm of other organs or systems: Secondary | ICD-10-CM

## 2022-08-31 DIAGNOSIS — G4452 New daily persistent headache (NDPH): Secondary | ICD-10-CM | POA: Diagnosis not present

## 2022-08-31 DIAGNOSIS — R5383 Other fatigue: Secondary | ICD-10-CM

## 2022-08-31 NOTE — Progress Notes (Signed)
Virtual Visit via Video Note  I connected with Kyle Duncan on 08/31/22 at  1:40 PM EST by a video enabled telemedicine application and verified that I am speaking with the correct person using two identifiers.   I discussed the limitations of evaluation and management by telemedicine and the availability of in person appointments. The patient expressed understanding and agreed to proceed.  Patient location: home Provider locations: office  Subjective:    CC: Multiple complaints  HPI: Pleasant 47 year old male presenting via MyChart video visit with multiple complaints as below:  Excessive daytime sleepiness: Has been using his CPAP for 2 months and reports that he is not feeling much better.  He feels like he still adjusting to the mask and still has some trouble sleeping despite the good numbers he has been seeing on his machine reports.  Notes that when he wakes up in the mornings, an hour later, he is ready for a nap and then again several hours later.  Exercising regularly and working on weight loss.  Has had several episodes of feeling winded at times.  He is able to do heavy weightlifting and cardiovascular exercises without difficulty.  Notes that he has these episodes at random times and with random activities.  2 noted episodes including walking up 1 flight of stairs and pushing a jack across the floor that was on wheels.  No contributing activities and no associated symptoms to help pinpoint the cause.  Has been having issues with new onset headaches.  These are happening almost daily in the temporal and occipital region.  They can happen at anytime of the day and do not seem to be related to any particular activity.  He has had his glasses prescription updated and he uses a bluelight filter when working on the computer.  Unfortunately this does not seem to help.  The headaches can last for minutes to hours and are not accompanied by other symptoms.  Is a bit concerned since his  biological father died at the age of 9 from brain cancer and his biological mother had what he suspects was a brain aneurysm.  Heartburn: Notes that he has had increasing bouts of heartburn lately especially on waking and sometimes when taking a deep breath.  Is not currently taking any medications.  Past medical history, Surgical history, Family history not pertinant except as noted below, Social history, Allergies, and medications have been entered into the medical record, reviewed, and corrections made.   Review of Systems: See HPI for pertinent positives and negatives.   Objective:    General: Speaking clearly in complete sentences without any shortness of breath.  Alert and oriented x3.  Normal judgment. No apparent acute distress.  Impression and Recommendations:    1. Family history of brain cancer Checking CEA.  With new onset headaches and family history, we will go ahead and get a brain MRI for further evaluation. - CEA - MR Brain Wo Contrast; Future  2. OSA (obstructive sleep apnea) Checking CBC with differential.  Continue CPAP use. - CBC with Differential/Platelet  3. Heartburn Recommend famotidine 20 mg twice daily as needed.  4. New daily persistent headache As stated above, plan for MRI brain for further evaluation.  Checking labs listed below.  Consider possible elevations in blood pressure versus increased stress.  Okay to use over-the-counter pain relievers but would avoid overuse to prevent rebound headaches.  5. Excessive daytime sleepiness 6.Fatigue, unspecified type Checking labs as below.  Unclear etiology.  Consider continued issues  with sleep apnea, sleep disorder, chronic fatigue syndrome, etc. - CBC with Differential/Platelet - COMPLETE METABOLIC PANEL WITH GFR - TSH - Hemoglobin A1c - VITAMIN D 25 Hydroxy (Vit-D Deficiency, Fractures) - Magnesium - Vitamin B12 - Iron, TIBC and Ferritin Panel - Testosterone   I discussed the assessment and  treatment plan with the patient. The patient was provided an opportunity to ask questions and all were answered. The patient agreed with the plan and demonstrated an understanding of the instructions.   The patient was advised to call back or seek an in-person evaluation if the symptoms worsen or if the condition fails to improve as anticipated.  25 minutes of non-face-to-face time was provided during this encounter.  Return if symptoms worsen or fail to improve.  Clearnce Sorrel, DNP, APRN, FNP-BC [facility]

## 2022-09-01 ENCOUNTER — Ambulatory Visit: Payer: Managed Care, Other (non HMO)

## 2022-09-01 ENCOUNTER — Encounter: Payer: Self-pay | Admitting: Emergency Medicine

## 2022-09-01 ENCOUNTER — Ambulatory Visit
Admission: EM | Admit: 2022-09-01 | Discharge: 2022-09-01 | Disposition: A | Discharge status: Home / Self Care | Payer: Managed Care, Other (non HMO) | Attending: Family Medicine | Admitting: Family Medicine

## 2022-09-01 DIAGNOSIS — R918 Other nonspecific abnormal finding of lung field: Secondary | ICD-10-CM

## 2022-09-01 DIAGNOSIS — R0602 Shortness of breath: Secondary | ICD-10-CM

## 2022-09-01 DIAGNOSIS — M549 Dorsalgia, unspecified: Secondary | ICD-10-CM | POA: Diagnosis not present

## 2022-09-01 DIAGNOSIS — Z8616 Personal history of COVID-19: Secondary | ICD-10-CM | POA: Diagnosis not present

## 2022-09-01 NOTE — ED Triage Notes (Signed)
Patient c/o middle back pain x today, no injury, some SOB.  The pain is dull at the moment.  Had COVID a month ago, having post COVID SOB.  Patient denies any OTC pain meds.

## 2022-09-01 NOTE — Discharge Instructions (Signed)
Continue to eat well and exercise I will report your results to your primary care doctor I would expect improvement with time

## 2022-09-01 NOTE — ED Provider Notes (Signed)
[name]    CSN: [id number] Arrival date & time: 09/01/22  1121      History   Chief Complaint Chief Complaint  Patient presents with   Back Pain    HPI Kyle Duncan is a 47 y.o. male.   HPI  Patient had COVID on 08/10/2022.  He states he has not felt completely well since then.  He states he can go to the gym and workout without difficulty, but other tasks such as walking up steps will sometimes make him very short of breath.  He discussed with his primary care doctor who suggested he should have an EKG and a chest x-ray.  He does not have wheezing.  Very little coughing.  Can walk on a treadmill without difficulty.  Woke up this morning with pain in his middle back, right between his shoulder blades to his T10 region.  There is no palpable tenderness.  No pain with deep breath.  No pain with movement.  Past Medical History:  Diagnosis Date   H/O: vasectomy 06/09/2016   01/2013   PSVT (paroxysmal supraventricular tachycardia)     Patient Active Problem List   Diagnosis Date Noted   Chronic pain of left ankle 09/01/2021   Acute irritant rhinitis 06/02/2021   Right lumbar radiculopathy 05/05/2021   Biceps tendinitis, left, distal 05/05/2021   Abnormal weight gain 01/13/2021   De Quervain's tenosynovitis, right 12/01/2020   Erectile dysfunction 12/01/2020   Cubital tunnel syndrome of both upper extremities 09/07/2017   Tennis elbow 09/07/2017   H/O: vasectomy 06/09/2016   Decreased testosterone level 06/03/2016   History of PSVT (paroxysmal supraventricular tachycardia) 05/31/2016   Bradycardia 05/31/2016   Fatigue 05/31/2016   Decreased libido 05/31/2016   Weight gain 05/31/2016    History reviewed. No pertinent surgical history.     Home Medications    Prior to Admission medications   Medication Sig Start Date End Date Taking? Authorizing Provider  AMBULATORY NON FORMULARY MEDICATION Continuous positive airway pressure (CPAP) machine set  on AutoPAP (10-20 cmH2O), with all supplemental supplies as needed. Please provide a large size [employer] Full Face Simplus Mask. 06/02/22  Yes Samuel Bouche, NP  calcium-vitamin D (OSCAL WITH D) 500-5 MG-MCG tablet Take 1 tablet by mouth.   Yes [provider]  MAGNESIUM HYDROXIDE PO Take by mouth.   Yes [provider]  Menaquinone-7 (K2 PO) Take by mouth.   Yes [provider]  Multiple Vitamin (MULTIVITAMIN WITH MINERALS) TABS tablet Take 1 tablet by mouth daily.   Yes [provider]  tadalafil (CIALIS) 10 MG tablet Take 1 tablet by mouth once daily 06/22/22  Yes Samuel Bouche, NP  Whey Protein POWD Take by mouth.   Yes [provider]  Zinc Acetate, Oral, (ZINC ACETATE PO) Take by mouth.   Yes [provider]    Family History Family History  Problem Relation Age of Onset   Hypertension Mother    Diabetes Mother    Cancer Father    Diabetes Maternal Grandmother    Hyperlipidemia Maternal Grandmother    Hypertension Maternal Grandmother    Breast cancer Maternal Grandmother    Hypertension Maternal Grandfather    Heart disease Maternal Grandfather    Skin cancer Maternal Grandfather    Heart attack Paternal Grandmother    Stroke Paternal Grandfather    Colon cancer Maternal Aunt    Stroke Paternal Uncle     Social History Social History   Tobacco Use   Smoking  status: Former    Types: Cigarettes   Smokeless tobacco: Never  Vaping Use   Vaping Use: Never used  Substance Use Topics   Alcohol use: Yes    Comment: socially   Drug use: Never     Allergies   Patient has no known allergies.   Review of Systems Review of Systems See HPI  Physical Exam Triage Vital Signs ED Triage Vitals  Enc Vitals Group     BP 09/01/22 1130 (!) 142/79     Pulse Rate 09/01/22 1130 (!) 58     Resp 09/01/22 1130 18     Temp 09/01/22 1130 99.1 F (37.3 C)     Temp Source 09/01/22 1130 Oral     SpO2 09/01/22 1130 99 %      Weight 09/01/22 1131 300 lb (136.1 kg)     Height 09/01/22 1131 5\' 10"  (1.778 m)     Head Circumference --      Peak Flow --      Pain Score 09/01/22 1131 3     Pain Loc --      Pain Edu? --      Excl. in [place]? --    No data found.  Updated Vital Signs BP (!) 142/79 (BP Location: Right Arm)   Pulse (!) 58   Temp 99.1 F (37.3 C) (Oral)   Resp 18   Ht 5\' 10"  (1.778 m)   Wt 136.1 kg   SpO2 99%   BMI 43.05 kg/m      Physical Exam Constitutional:      General: He is not in acute distress.    Appearance: Normal appearance. He is well-developed. He is not ill-appearing.     Comments: Stocky and muscular.  Moderately overweight  HENT:     Head: Normocephalic and atraumatic.  Eyes:     Conjunctiva/sclera: Conjunctivae normal.     Pupils: Pupils are equal, round, and reactive to light.  Cardiovascular:     Rate and Rhythm: Normal rate and regular rhythm.     Heart sounds: Normal heart sounds.  Pulmonary:     Effort: Pulmonary effort is normal. No respiratory distress.     Breath sounds: Normal breath sounds.  Chest:     Chest wall: No tenderness.  Abdominal:     General: There is no distension.     Palpations: Abdomen is soft.  Musculoskeletal:        General: No tenderness. Normal range of motion.     Cervical back: Normal range of motion.     Comments: No tenderness to palpation down the thoracic spinous processes or paraspinous muscles  Skin:    General: Skin is warm and dry.  Neurological:     General: No focal deficit present.     Mental Status: He is alert.     Gait: Gait normal.      UC Treatments / Results  Labs (all labs ordered are listed, but only abnormal results are displayed) Labs Reviewed - No data to display  EKG   Radiology DG Chest 2 View  Result Date: 09/01/2022 CLINICAL DATA:  Shortness of breath.  COVID 1 month ago EXAM: CHEST - 2 VIEW COMPARISON:  None Available. FINDINGS: No consolidation, pneumothorax or effusion. Normal  cardiopericardial silhouette without edema. Minimal degenerative changes of the spine. Slight areas of atelectasis or scar in the inferior right upper lobe. IMPRESSION: Slight right midlung scar or atelectasis. Otherwise no acute cardiopulmonary disease Electronically Signed   By: Roosevelt Locks  Chales Abrahams M.D.   On: 09/01/2022 12:36    Procedures ED EKG  Date/Time: 09/01/2022 12:32 PM  Performed by: Eustace Moore, MD Authorized by: Eustace Moore, MD   ECG reviewed by ED Physician in the absence of a cardiologist: yes   Previous ECG:    Previous ECG:  Compared to current   Similarity:  No change   Comparison ECG info:  2017.  LAD Interpretation:    Interpretation: non-specific     Details:  Lad Rate:    ECG rate:  64   ECG rate assessment: normal   Rhythm:    Rhythm: sinus rhythm   Ectopy:    Ectopy: none   QRS:    QRS axis:  Left   QRS intervals:  Normal   QRS conduction: normal   ST segments:    ST segments:  Normal T waves:    T waves: normal   Q waves:    Abnormal Q-waves: not present     Medications Ordered in UC Medications - No data to display  Initial Impression / Assessment and Plan / UC Course  I have reviewed the triage vital signs and the nursing notes.  Pertinent labs & imaging results that were available during my care of the patient were reviewed by me and considered in my medical decision making (see chart for details).     I told patient his episodic shortness of breath with exertion could still be sequela from his COVID last month.  I told him to expect gradual improvement.  I offered him ibuprofen for his back pain but he states the pain was not severe enough.  He will follow-up with his PCP Final Clinical Impressions(s) / UC Diagnoses   Final diagnoses:  Mid-back pain, acute  SOB (shortness of breath) on exertion  Abnormality of lung on CXR     Discharge Instructions      Continue to eat well and exercise I will report your results to your  primary care doctor I would expect improvement with time     ED Prescriptions   None    PDMP not reviewed this encounter.   Eustace Moore, MD 09/01/22 [phone]

## 2022-09-02 ENCOUNTER — Other Ambulatory Visit: Payer: Self-pay | Admitting: Medical-Surgical

## 2022-09-02 DIAGNOSIS — N529 Male erectile dysfunction, unspecified: Secondary | ICD-10-CM

## 2022-09-03 ENCOUNTER — Other Ambulatory Visit: Payer: Self-pay | Admitting: Medical-Surgical

## 2022-09-03 DIAGNOSIS — N529 Male erectile dysfunction, unspecified: Secondary | ICD-10-CM

## 2022-09-03 MED ORDER — TADALAFIL 10 MG PO TABS: 10.0000 mg | ORAL_TABLET | Freq: Every day | ORAL | 0 refills | Status: DC

## 2022-09-11 ENCOUNTER — Ambulatory Visit (INDEPENDENT_AMBULATORY_CARE_PROVIDER_SITE_OTHER): Payer: Managed Care, Other (non HMO)

## 2022-09-11 DIAGNOSIS — Z808 Family history of malignant neoplasm of other organs or systems: Secondary | ICD-10-CM | POA: Diagnosis not present

## 2022-09-11 DIAGNOSIS — R519 Headache, unspecified: Secondary | ICD-10-CM

## 2022-09-12 ENCOUNTER — Encounter: Payer: Self-pay | Admitting: Medical-Surgical

## 2022-11-15 ENCOUNTER — Other Ambulatory Visit: Payer: Self-pay

## 2022-11-15 ENCOUNTER — Encounter: Payer: Self-pay | Admitting: Emergency Medicine

## 2022-11-15 ENCOUNTER — Ambulatory Visit
Admission: EM | Admit: 2022-11-15 | Discharge: 2022-11-15 | Disposition: A | Discharge status: Home / Self Care | Payer: Managed Care, Other (non HMO) | Attending: Family Medicine | Admitting: Family Medicine

## 2022-11-15 DIAGNOSIS — J069 Acute upper respiratory infection, unspecified: Secondary | ICD-10-CM | POA: Diagnosis not present

## 2022-11-15 DIAGNOSIS — R0981 Nasal congestion: Secondary | ICD-10-CM | POA: Diagnosis not present

## 2022-11-15 LAB — POCT INFLUENZA A/B
Influenza A, POC: NEGATIVE
Influenza B, POC: NEGATIVE

## 2022-11-15 LAB — POC SARS CORONAVIRUS 2 AG -  ED: SARS Coronavirus 2 Ag: NEGATIVE

## 2022-11-15 MED ORDER — PREDNISONE 50 MG PO TABS: ORAL_TABLET | ORAL | 0 refills | Status: DC

## 2022-11-15 NOTE — ED Triage Notes (Signed)
Patient presents to [facility] for evaluation of stuffy, runny nose starting today with some soreness to the top of his throw, "like post nasal drip".  Unsure if he has allergies, sinus infection, or COVID.

## 2022-11-15 NOTE — Discharge Instructions (Signed)
Prednisone once a day for 5 days Drink lots of fluids May take over-the-counter cough or cold medicine as needed

## 2022-11-15 NOTE — ED Provider Notes (Signed)
[name]    CSN: [id number] Arrival date & time: 11/15/22  1356      History   Chief Complaint Chief Complaint  Patient presents with   Nasal Congestion    HPI Kyle Duncan is a 47 y.o. male.   HPI  Patient states that he has a stuffy runny nose that started this morning when he woke up.  He states that his uvula and the top of his throat is sore.  He has some postnasal drip.  He states the last time he felt like his he had COVID.  That was 3 months ago.  He is here for COVID testing.  He also has allergies.  With sinus pressure and postnasal drip.  Could be an allergy attack.  Uncertain  Past Medical History:  Diagnosis Date   H/O: vasectomy 06/09/2016   01/2013   PSVT (paroxysmal supraventricular tachycardia)     Patient Active Problem List   Diagnosis Date Noted   Chronic pain of left ankle 09/01/2021   Acute irritant rhinitis 06/02/2021   Right lumbar radiculopathy 05/05/2021   Biceps tendinitis, left, distal 05/05/2021   Abnormal weight gain 01/13/2021   De Quervain's tenosynovitis, right 12/01/2020   Erectile dysfunction 12/01/2020   Cubital tunnel syndrome of both upper extremities 09/07/2017   Tennis elbow 09/07/2017   H/O: vasectomy 06/09/2016   Decreased testosterone level 06/03/2016   History of PSVT (paroxysmal supraventricular tachycardia) 05/31/2016   Bradycardia 05/31/2016   Fatigue 05/31/2016   Decreased libido 05/31/2016   Weight gain 05/31/2016    History reviewed. No pertinent surgical history.     Home Medications    Prior to Admission medications   Medication Sig Start Date End Date Taking? Authorizing Provider  predniSONE (DELTASONE) 50 MG tablet Take once a day for 5 days.  Take with food 11/15/22  Yes Raylene Everts, MD  AMBULATORY NON FORMULARY MEDICATION Continuous positive airway pressure (CPAP) machine set on AutoPAP (10-20 cmH2O), with all supplemental supplies as needed. Please provide a large size  [employer] Full Face Simplus Mask. 06/02/22   Samuel Bouche, NP  calcium-vitamin D (OSCAL WITH D) 500-5 MG-MCG tablet Take 1 tablet by mouth.    [provider]  MAGNESIUM HYDROXIDE PO Take by mouth.    [provider]  Menaquinone-7 (K2 PO) Take by mouth.    [provider]  Multiple Vitamin (MULTIVITAMIN WITH MINERALS) TABS tablet Take 1 tablet by mouth daily.    [provider]  tadalafil (CIALIS) 10 MG tablet Take 1 tablet (10 mg total) by mouth daily. 09/03/22   Samuel Bouche, NP  Whey Protein POWD Take by mouth.    [provider]  Zinc Acetate, Oral, (ZINC ACETATE PO) Take by mouth.    [provider]    Family History Family History  Problem Relation Age of Onset   Hypertension Mother    Diabetes Mother    Cancer Father    Diabetes Maternal Grandmother    Hyperlipidemia Maternal Grandmother    Hypertension Maternal Grandmother    Breast cancer Maternal Grandmother    Hypertension Maternal Grandfather    Heart disease Maternal Grandfather    Skin cancer Maternal Grandfather    Heart attack Paternal Grandmother    Stroke Paternal Grandfather    Colon cancer Maternal Aunt    Stroke Paternal Uncle     Social History Social History   Tobacco Use   Smoking status: Former    Types: Cigarettes   Smokeless  tobacco: Never  Vaping Use   Vaping Use: Never used  Substance Use Topics   Alcohol use: Yes    Comment: socially   Drug use: Never     Allergies   Patient has no known allergies.   Review of Systems Review of Systems See HPI  Physical Exam Triage Vital Signs ED Triage Vitals  Enc Vitals Group     BP 11/15/22 1412 127/76     Pulse Rate 11/15/22 1412 67     Resp 11/15/22 1412 18     Temp 11/15/22 1412 98.7 F (37.1 C)     Temp Source 11/15/22 1412 Oral     SpO2 11/15/22 1412 98 %     Weight --      Height --      Head Circumference --      Peak Flow --      Pain Score 11/15/22 1413 0     Pain Loc  --      Pain Edu? --      Excl. in [place]? --    No data found.  Updated Vital Signs BP 127/76   Pulse 67   Temp 98.7 F (37.1 C) (Oral)   Resp 18   SpO2 98%       Physical Exam Constitutional:      General: He is not in acute distress.    Appearance: He is well-developed. He is ill-appearing.  HENT:     Head: Normocephalic and atraumatic.     Right Ear: Tympanic membrane and ear canal normal.     Left Ear: Tympanic membrane and ear canal normal.     Nose: Congestion present. No rhinorrhea.     Mouth/Throat:     Mouth: Mucous membranes are moist.     Pharynx: Posterior oropharyngeal erythema present.     Comments: Mild erythema and swelling of uvula Eyes:     Conjunctiva/sclera: Conjunctivae normal.     Pupils: Pupils are equal, round, and reactive to light.  Cardiovascular:     Rate and Rhythm: Normal rate and regular rhythm.     Heart sounds: Normal heart sounds.  Pulmonary:     Effort: Pulmonary effort is normal. No respiratory distress.     Breath sounds: Normal breath sounds.  Abdominal:     General: There is no distension.     Palpations: Abdomen is soft.  Musculoskeletal:        General: Normal range of motion.     Cervical back: Normal range of motion.  Lymphadenopathy:     Cervical: No cervical adenopathy.  Skin:    General: Skin is warm and dry.  Neurological:     Mental Status: He is alert.     Gait: Gait normal.      UC Treatments / Results  Labs (all labs ordered are listed, but only abnormal results are displayed) Labs Reviewed  POC SARS CORONAVIRUS 2 AG -  ED  POCT INFLUENZA A/B    EKG   Radiology No results found.  Procedures Procedures (including critical care time)  Medications Ordered in UC Medications - No data to display  Initial Impression / Assessment and Plan / UC Course  I have reviewed the triage vital signs and the nursing notes.  Pertinent labs & imaging results that were available during my care of the patient were  reviewed by me and considered in my medical decision making (see chart for details).     Final Clinical Impressions(s) / UC Diagnoses  Final diagnoses:  Nasal congestion  Viral upper respiratory tract infection  Acute upper respiratory infection     Discharge Instructions      Prednisone once a day for 5 days Drink lots of fluids May take over-the-counter cough or cold medicine as needed   ED Prescriptions     Medication Sig Dispense Auth. Provider   predniSONE (DELTASONE) 50 MG tablet Take once a day for 5 days.  Take with food 5 tablet Raylene Everts, MD      PDMP not reviewed this encounter.   Raylene Everts, MD 11/15/22 [phone]

## 2022-12-06 ENCOUNTER — Encounter: Payer: Self-pay | Admitting: *Deleted

## 2023-01-12 ENCOUNTER — Other Ambulatory Visit: Payer: Self-pay | Admitting: Medical-Surgical

## 2023-01-12 DIAGNOSIS — N529 Male erectile dysfunction, unspecified: Secondary | ICD-10-CM

## 2023-01-13 MED ORDER — TADALAFIL 10 MG PO TABS: 10.0000 mg | ORAL_TABLET | Freq: Every day | ORAL | 0 refills | Status: DC

## 2023-02-02 ENCOUNTER — Encounter: Payer: Self-pay | Admitting: Medical-Surgical

## 2023-02-02 ENCOUNTER — Ambulatory Visit: Payer: Managed Care, Other (non HMO) | Admitting: Medical-Surgical

## 2023-02-02 VITALS — BP 137/68 | HR 63 | Resp 20 | Ht 70.0 in | Wt 302.5 lb

## 2023-02-02 DIAGNOSIS — R1032 Left lower quadrant pain: Secondary | ICD-10-CM | POA: Diagnosis not present

## 2023-02-02 DIAGNOSIS — Z808 Family history of malignant neoplasm of other organs or systems: Secondary | ICD-10-CM | POA: Diagnosis not present

## 2023-02-02 DIAGNOSIS — Z1211 Encounter for screening for malignant neoplasm of colon: Secondary | ICD-10-CM

## 2023-02-02 DIAGNOSIS — R5383 Other fatigue: Secondary | ICD-10-CM

## 2023-02-02 NOTE — Progress Notes (Signed)
Established patient visit  History, exam, impression, and plan:  1. Left lower quadrant abdominal pain Pleasant 47 year old male presenting today with reports of 1 week of lower abdominal discomfort involving the left lower quadrant and just below the bellybutton.  No recent injuries.  No history of abdominal surgeries.  Chronic irregular bowel habits from constipation to diarrhea however has seen no melena or hematochezia.  Denies fever, chills, nausea, vomiting, hematemesis.  No urinary symptoms.  No history of kidney stones.  Has not taken anything for the pain.  Notes that prolonged sitting seems to make it worse.  He does exercise regularly and lifts heavy weights so is not sure if this may have caused an injury.  See below for physical exam.  With significant left lower quadrant tenderness, concern for diverticulitis versus ureteral stone.  Ordering stat CT abdomen pelvis for further investigation.  Referring to GI. - Ambulatory referral to Gastroenterology - CT Abdomen Pelvis Wo Contrast; Future  2. Colon cancer screening Had a Cologuard done at the [place] a couple of years ago and has found that this is not always the most accurate option.  He does have a strong family history of various cancers and is interested in pursuing a colonoscopy.  Referring to GI. - Ambulatory referral to Gastroenterology  3. Family history of brain cancer Family history of brain cancer as well as multiple other cancers.  Checking CEA per patient request. - CEA  4. Fatigue, unspecified type We discussed issues with fatigue back in January and lab orders were placed for further evaluation.  He did not have these drawn at that time but would like to go ahead and get that today.  Checking labs as below. - CBC with Differential/Platelet - COMPLETE METABOLIC PANEL WITH GFR - Lipid panel - TSH - Hemoglobin A1c - VITAMIN D 25 Hydroxy (Vit-D Deficiency, Fractures) - Iron, TIBC and Ferritin Panel - Vitamin  B12 - Testosterone - Magnesium  Physical Exam Vitals reviewed.  Constitutional:      General: He is not in acute distress.    Appearance: Normal appearance. He is well-developed. He is obese.  HENT:     Head: Normocephalic and atraumatic.  Cardiovascular:     Rate and Rhythm: Normal rate and regular rhythm.     Pulses: Normal pulses.     Heart sounds: Normal heart sounds. No murmur heard.    No friction rub. No gallop.  Pulmonary:     Effort: Pulmonary effort is normal. No respiratory distress.     Breath sounds: Normal breath sounds.  Abdominal:     General: Bowel sounds are normal. There is no distension or abdominal bruit. There are no signs of injury.     Palpations: Abdomen is soft. There is no hepatomegaly, splenomegaly or mass.     Tenderness: There is abdominal tenderness in the left lower quadrant. There is guarding (Mild with moderate palpation of the left lower quadrant.). There is no rebound.     Hernia: No hernia is present.  Skin:    General: Skin is warm and dry.  Neurological:     Mental Status: He is alert and oriented to person, place, and time.  Psychiatric:        Mood and Affect: Mood normal.        Behavior: Behavior normal.        Thought Content: Thought content normal.        Judgment: Judgment normal.   Procedures  performed this visit: None.  Return if symptoms worsen or fail to improve.  __________________________________ Thayer Ohm, DNP, APRN, FNP-BC Primary Care and Sports Medicine [facility] [place]

## 2023-02-03 LAB — CBC WITH DIFFERENTIAL/PLATELET
Absolute Monocytes: 567 cells/uL (ref 200–950)
Basophils Absolute: 88 cells/uL (ref 0–200)
Basophils Relative: 1.6 %
Eosinophils Absolute: 248 cells/uL (ref 15–500)
Eosinophils Relative: 4.5 %
HCT: 47.6 % (ref 38.5–50.0)
Hemoglobin: 16.2 g/dL (ref 13.2–17.1)
Lymphs Abs: 2052 cells/uL (ref 850–3900)
MCH: 32.1 pg (ref 27.0–33.0)
MCHC: 34 g/dL (ref 32.0–36.0)
MCV: 94.3 fL (ref 80.0–100.0)
MPV: 10.2 fL (ref 7.5–12.5)
Monocytes Relative: 10.3 %
Neutro Abs: 2547 cells/uL (ref 1500–7800)
Neutrophils Relative %: 46.3 %
Platelets: 243 10*3/uL (ref 140–400)
RBC: 5.05 10*6/uL (ref 4.20–5.80)
RDW: 12.1 % (ref 11.0–15.0)
Total Lymphocyte: 37.3 %
WBC: 5.5 10*3/uL (ref 3.8–10.8)

## 2023-02-03 LAB — LIPID PANEL
Cholesterol: 176 mg/dL (ref <200)
HDL: 78 mg/dL (ref >=40)
LDL Cholesterol (Calc): 78 mg/dL (calc)
Non-HDL Cholesterol (Calc): 98 mg/dL (calc) (ref <130)
Total CHOL/HDL Ratio: 2.3 (calc) (ref <5.0)
Triglycerides: 114 mg/dL (ref <150)

## 2023-02-03 LAB — VITAMIN D 25 HYDROXY (VIT D DEFICIENCY, FRACTURES): Vit D, 25-Hydroxy: 50 ng/mL (ref 30–100)

## 2023-02-03 LAB — VITAMIN B12: Vitamin B-12: 580 pg/mL (ref 200–1100)

## 2023-02-03 LAB — COMPLETE METABOLIC PANEL WITH GFR
AG Ratio: 2.2 (calc) (ref 1.0–2.5)
ALT: 28 U/L (ref 9–46)
AST: 25 U/L (ref 10–40)
Albumin: 4.4 g/dL (ref 3.6–5.1)
Alkaline phosphatase (APISO): 49 U/L (ref 36–130)
BUN: 17 mg/dL (ref 7–25)
CO2: 30 mmol/L (ref 20–32)
Calcium: 9.6 mg/dL (ref 8.6–10.3)
Chloride: 103 mmol/L (ref 98–110)
Creat: 1.29 mg/dL (ref 0.60–1.29)
Globulin: 2 g/dL (calc) (ref 1.9–3.7)
Glucose, Bld: 87 mg/dL (ref 65–99)
Potassium: 4.6 mmol/L (ref 3.5–5.3)
Sodium: 139 mmol/L (ref 135–146)
Total Bilirubin: 1 mg/dL (ref 0.2–1.2)
Total Protein: 6.4 g/dL (ref 6.1–8.1)
eGFR: 68 mL/min/{1.73_m2} (ref >=60)

## 2023-02-03 LAB — IRON,TIBC AND FERRITIN PANEL
%SAT: 53 % (calc) — ABNORMAL HIGH (ref 20–48)
Ferritin: 61 ng/mL (ref 38–380)
Iron: 170 ug/dL (ref 50–180)
TIBC: 318 mcg/dL (calc) (ref 250–425)

## 2023-02-03 LAB — HEMOGLOBIN A1C
Hgb A1c MFr Bld: 5.6 % of total Hgb (ref <5.7)
Mean Plasma Glucose: 114 mg/dL
eAG (mmol/L): 6.3 mmol/L

## 2023-02-03 LAB — MAGNESIUM: Magnesium: 2.1 mg/dL (ref 1.5–2.5)

## 2023-02-03 LAB — TESTOSTERONE: Testosterone: 1126 ng/dL — ABNORMAL HIGH (ref 250–827)

## 2023-02-03 LAB — TSH: TSH: 1.82 mIU/L (ref 0.40–4.50)

## 2023-02-03 LAB — CEA: CEA: <2 ng/mL

## 2023-02-09 ENCOUNTER — Encounter: Payer: Self-pay | Admitting: Medical-Surgical

## 2023-02-15 ENCOUNTER — Telehealth: Payer: Self-pay

## 2023-02-15 NOTE — Telephone Encounter (Signed)
[employer] has received the additional clinical reconsideration information for the denied study that was submitted. However, [place] state regulations require that the member consent be obtained prior to staring a reconsideration or appeal. In order to process your post decision request we must receive a statement within days after the initial denial that the member has been informed of and agrees to this post decision request. The provider may call [phone] or fax the information to [phone]. Case #[id number].

## 2023-02-17 ENCOUNTER — Encounter: Payer: Self-pay | Admitting: Medical-Surgical

## 2023-02-17 NOTE — Telephone Encounter (Signed)
Task completed. The imaging department has been notified to contact the patient for scheduling.

## 2023-02-18 ENCOUNTER — Ambulatory Visit (INDEPENDENT_AMBULATORY_CARE_PROVIDER_SITE_OTHER): Payer: Managed Care, Other (non HMO)

## 2023-02-18 DIAGNOSIS — R1032 Left lower quadrant pain: Secondary | ICD-10-CM | POA: Diagnosis not present

## 2023-02-23 ENCOUNTER — Other Ambulatory Visit: Payer: Self-pay | Admitting: Medical-Surgical

## 2023-02-23 DIAGNOSIS — R1032 Left lower quadrant pain: Secondary | ICD-10-CM

## 2023-03-02 ENCOUNTER — Encounter: Payer: Self-pay | Admitting: Emergency Medicine

## 2023-03-02 ENCOUNTER — Ambulatory Visit
Admission: EM | Admit: 2023-03-02 | Discharge: 2023-03-02 | Disposition: A | Discharge status: Home / Self Care | Payer: Managed Care, Other (non HMO) | Attending: Family Medicine | Admitting: Family Medicine

## 2023-03-02 DIAGNOSIS — J302 Other seasonal allergic rhinitis: Secondary | ICD-10-CM

## 2023-03-02 DIAGNOSIS — J3089 Other allergic rhinitis: Secondary | ICD-10-CM | POA: Diagnosis not present

## 2023-03-02 MED ORDER — FLUTICASONE PROPIONATE 50 MCG/ACT NA SUSP: 2.0000 | Freq: Every day | NASAL | 0 refills | Status: DC

## 2023-03-02 MED ORDER — METHYLPREDNISOLONE ACETATE 80 MG/ML IJ SUSP
80.0000 mg | Freq: Once | INTRAMUSCULAR | Status: AC
  Administered 2023-03-02: 80 mg via INTRAMUSCULAR

## 2023-03-02 NOTE — ED Triage Notes (Signed)
Pt c/o nasal congestion, post nasal drip and right ear fullness for the last couple days. He states this has been happening often and wonders if its allergies. He does not take allergy meds daily. Afebrile.

## 2023-03-02 NOTE — ED Provider Notes (Signed)
[facility]    CSN: [id number] Arrival date & time: 03/02/23  1215      History   Chief Complaint Chief Complaint  Patient presents with   Nasal Congestion    HPI Mehkai Gallo is a 47 y.o. male.   HPI  Kahleel is here for nasal congestion.  He states he has some postnasal drip.  Nasal congestion.  Ear pressure and pain on the right.  A feeling of fluid behind the right ear.  This has been going on for the last couple days.  He is frustrated because he keeps getting these symptoms over and over again.  He does not know whether it is a cold, allergies, or sinus infection.  No coughing or chest congestion.  No fever chills body aches or headache  Past Medical History:  Diagnosis Date   H/O: vasectomy 06/09/2016   01/2013   PSVT (paroxysmal supraventricular tachycardia)     Patient Active Problem List   Diagnosis Date Noted   Chronic pain of left ankle 09/01/2021   Acute irritant rhinitis 06/02/2021   Right lumbar radiculopathy 05/05/2021   Biceps tendinitis, left, distal 05/05/2021   Abnormal weight gain 01/13/2021   De Quervain's tenosynovitis, right 12/01/2020   Erectile dysfunction 12/01/2020   Cubital tunnel syndrome of both upper extremities 09/07/2017   Tennis elbow 09/07/2017   H/O: vasectomy 06/09/2016   Decreased testosterone level 06/03/2016   History of PSVT (paroxysmal supraventricular tachycardia) 05/31/2016   Bradycardia 05/31/2016   Fatigue 05/31/2016   Decreased libido 05/31/2016   Weight gain 05/31/2016    History reviewed. No pertinent surgical history.     Home Medications    Prior to Admission medications   Medication Sig Start Date End Date Taking? Authorizing Provider  fluticasone (FLONASE) 50 MCG/ACT nasal spray Place 2 sprays into both nostrils daily. 03/02/23  Yes Eustace Moore, MD  AMBULATORY NON FORMULARY MEDICATION Continuous positive airway pressure (CPAP) machine set on AutoPAP (10-20 cmH2O), with all  supplemental supplies as needed. Please provide a large size [employer] Full Face Simplus Mask. 06/02/22   Christen Butter, NP  calcium-vitamin D (OSCAL WITH D) 500-5 MG-MCG tablet Take 1 tablet by mouth.    [provider]  MAGNESIUM HYDROXIDE PO Take by mouth.    [provider]  Menaquinone-7 (K2 PO) Take by mouth.    [provider]  Multiple Vitamin (MULTIVITAMIN WITH MINERALS) TABS tablet Take 1 tablet by mouth daily.    [provider]  tadalafil (CIALIS) 10 MG tablet Take 1 tablet (10 mg total) by mouth daily. 01/13/23   Christen Butter, NP  Whey Protein POWD Take by mouth.    [provider]  Zinc Acetate, Oral, (ZINC ACETATE PO) Take by mouth.    [provider]    Family History Family History  Problem Relation Age of Onset   Hypertension Mother    Diabetes Mother    Cancer Father    Diabetes Maternal Grandmother    Hyperlipidemia Maternal Grandmother    Hypertension Maternal Grandmother    Breast cancer Maternal Grandmother    Hypertension Maternal Grandfather    Heart disease Maternal Grandfather    Skin cancer Maternal Grandfather    Heart attack Paternal Grandmother    Stroke Paternal Grandfather    Colon cancer Maternal Aunt    Stroke Paternal Uncle     Social History Social History   Tobacco Use   Smoking status: Former    Types: Cigarettes  Smokeless tobacco: Never  Vaping Use   Vaping Use: Never used  Substance Use Topics   Alcohol use: Yes    Comment: socially   Drug use: Never     Allergies   Patient has no known allergies.   Review of Systems Review of Systems See HPI  Physical Exam Triage Vital Signs ED Triage Vitals [03/02/23 1312]  Enc Vitals Group     BP 120/74     Pulse Rate 78     Resp 18     Temp 98 F (36.7 C)     Temp Source Oral     SpO2 96 %     Weight      Height      Head Circumference      Peak Flow      Pain Score 0     Pain Loc      Pain Edu?      Excl. in [place]?     No data found.  Updated Vital Signs BP 120/74 (BP Location: Right Arm)   Pulse 78   Temp 98 F (36.7 C) (Oral)   Resp 18   SpO2 96%      Physical Exam Constitutional:      General: He is not in acute distress.    Appearance: He is well-developed. He is ill-appearing.     Comments: Stocky and muscular  HENT:     Head: Normocephalic and atraumatic.     Ears:     Comments: Right TM retracted    Nose: Congestion and rhinorrhea present.     Comments: Nasal membranes swollen and boggy.  Clear rhinorrhea    Mouth/Throat:     Pharynx: Posterior oropharyngeal erythema present.  Eyes:     Conjunctiva/sclera: Conjunctivae normal.     Pupils: Pupils are equal, round, and reactive to light.  Cardiovascular:     Rate and Rhythm: Normal rate and regular rhythm.     Heart sounds: Normal heart sounds.  Pulmonary:     Effort: Pulmonary effort is normal. No respiratory distress.     Breath sounds: Normal breath sounds.  Abdominal:     General: There is no distension.     Palpations: Abdomen is soft.  Musculoskeletal:        General: Normal range of motion.     Cervical back: Normal range of motion.  Lymphadenopathy:     Cervical: Cervical adenopathy present.  Skin:    General: Skin is warm and dry.  Neurological:     Mental Status: He is alert.      UC Treatments / Results  Labs (all labs ordered are listed, but only abnormal results are displayed) Labs Reviewed - No data to display  EKG   Radiology No results found.  Procedures Procedures (including critical care time)  Medications Ordered in UC Medications  methylPREDNISolone acetate (DEPO-MEDROL) injection 80 mg (has no administration in time range)    Initial Impression / Assessment and Plan / UC Course  I have reviewed the triage vital signs and the nursing notes.  Pertinent labs & imaging results that were available during my care of the patient were reviewed by me and considered in my medical decision  making (see chart for details).     Patient states that he is miserable and has a lot going on this weekend.  And get a give him a shot of Depo-Medrol to his hopefully make him feel better quickly.  Also a steroid nasal spray, Flonase  for maintenance.  I told him to continue to use this to help prevent recurrences.  Follow-up with PCP Final Clinical Impressions(s) / UC Diagnoses   Final diagnoses:  Non-seasonal allergic rhinitis, unspecified trigger     Discharge Instructions      May use OTC cough and cold meds Start the flonase tonight Use daily See Joy in followup Call for problems   ED Prescriptions     Medication Sig Dispense Auth. Provider   fluticasone (FLONASE) 50 MCG/ACT nasal spray Place 2 sprays into both nostrils daily. 16 g Eustace Moore, MD      PDMP not reviewed this encounter.   Eustace Moore, MD 03/02/23 [phone]

## 2023-03-02 NOTE — Discharge Instructions (Signed)
May use OTC cough and cold meds Start the flonase tonight Use daily See Joy in followup Call for problems

## 2023-03-07 ENCOUNTER — Telehealth (INDEPENDENT_AMBULATORY_CARE_PROVIDER_SITE_OTHER): Payer: Managed Care, Other (non HMO) | Admitting: Medical-Surgical

## 2023-03-07 ENCOUNTER — Encounter: Payer: Self-pay | Admitting: Medical-Surgical

## 2023-03-07 DIAGNOSIS — J329 Chronic sinusitis, unspecified: Secondary | ICD-10-CM

## 2023-03-07 NOTE — Progress Notes (Signed)
Virtual Visit via Video Note  I connected with Kyle Duncan on 03/07/23 at  1:40 PM EDT by a video enabled telemedicine application and verified that I am speaking with the correct person using two identifiers.   I discussed the limitations of evaluation and management by telemedicine and the availability of in person appointments. The patient expressed understanding and agreed to proceed.  Patient location: home Provider locations: office  Subjective:    CC: Chronic sinus issues  HPI: Pleasant 47 year old male presenting via MyChart video visit with reports of chronic sinus issues.  Approximately every 4 to 6 weeks, he is having significant issues with sinus congestion, sneezing, rhinorrhea, and facial pressure.  Using Flonase but this has not helped tremendously.  When he is feeling significantly congested, he will take Zyrtec 10 mg daily but does not take this regularly.  Is interested in testing and recommendations for further treatment.  Has difficulty wearing his CPAP when he is congested.   Past medical history, Surgical history, Family history not pertinant except as noted below, Social history, Allergies, and medications have been entered into the medical record, reviewed, and corrections made.   Review of Systems: See HPI for pertinent positives and negatives.   Objective:    General: Speaking clearly in complete sentences without any shortness of breath.  Alert and oriented x3.  Normal judgment. No apparent acute distress.  Impression and Recommendations:    1. Chronic congestion of paranasal sinus No issues in earlier years but has developed chronic congestion.  Would recommend daily antihistamine dosing rather than daily as needed.  Continue Flonase as prescribed.  Referring to allergy per patient request. - Ambulatory referral to Allergy  I discussed the assessment and treatment plan with the patient. The patient was provided an opportunity to ask questions and all  were answered. The patient agreed with the plan and demonstrated an understanding of the instructions.   The patient was advised to call back or seek an in-person evaluation if the symptoms worsen or if the condition fails to improve as anticipated.  20 minutes of non-face-to-face time was provided during this encounter.  Return if symptoms worsen or fail to improve.  Thayer Ohm, DNP, APRN, FNP-BC [facility]

## 2023-03-28 NOTE — Progress Notes (Unsigned)
New Patient Note  RE: Kyle Duncan MRN: [id number] DOB: [birth year] Date of Office Visit: 03/29/2023  Consult requested by: Christen Butter, NP Primary care provider: Christen Butter, NP  Chief Complaint: No chief complaint on file.  History of Present Illness: I had the pleasure of seeing Kyle Duncan for initial evaluation [facility]  on 03/28/2023. He is a 47 y.o. male, who is referred here by Christen Butter, NP for the evaluation of allergies.  He reports symptoms of ***. Symptoms have been going on for *** years. The symptoms are present *** all year around with worsening in ***. Other triggers include exposure to ***. Anosmia: ***. Headache: ***. He has used *** with ***fair improvement in symptoms. Sinus infections: ***. Previous work up includes: ***. Previous ENT evaluation: ***. Previous sinus imaging: ***. History of nasal polyps: ***. Last eye exam: ***. History of reflux: ***.  03/07/2023 PCP visit: "Approximately every 4 to 6 weeks, he is having significant issues with sinus congestion, sneezing, rhinorrhea, and facial pressure. Using Flonase but this has not helped tremendously. When he is feeling significantly congested, he will take Zyrtec 10 mg daily but does not take this regularly. Is interested in testing and recommendations for further treatment. Has difficulty wearing his CPAP when he is congested. "  Assessment and Plan: Efthimios is a 47 y.o. male with: No problem-specific Assessment & Plan notes found for this encounter.  No follow-ups on file.  No orders of the defined types were placed in this encounter.  Lab Orders  No laboratory test(s) ordered today    Other allergy screening: Asthma: {Blank single:19197::"yes","no"} Rhino conjunctivitis: {Blank single:19197::"yes","no"} Food allergy: {Blank single:19197::"yes","no"} Medication allergy: {Blank single:19197::"yes","no"} Hymenoptera allergy: {Blank  single:19197::"yes","no"} Urticaria: {Blank single:19197::"yes","no"} Eczema:{Blank single:19197::"yes","no"} History of recurrent infections suggestive of immunodeficency: {Blank single:19197::"yes","no"}  Diagnostics: Spirometry:  Tracings reviewed. His effort: {Blank single:19197::"Good reproducible efforts.","It was hard to get consistent efforts and there is a question as to whether this reflects a maximal maneuver.","Poor effort, data can not be interpreted."} FVC: ***L FEV1: ***L, ***% predicted FEV1/FVC ratio: ***% Interpretation: {Blank single:19197::"Spirometry consistent with mild obstructive disease","Spirometry consistent with moderate obstructive disease","Spirometry consistent with severe obstructive disease","Spirometry consistent with possible restrictive disease","Spirometry consistent with mixed obstructive and restrictive disease","Spirometry uninterpretable due to technique","Spirometry consistent with normal pattern","No overt abnormalities noted given today's efforts"}.  Please see scanned spirometry results for details.  Skin Testing: {Blank single:19197::"Select foods","Environmental allergy panel","Environmental allergy panel and select foods","Food allergy panel","None","Deferred due to recent antihistamines use"}. *** Results discussed with patient/family.   Past Medical History: Patient Active Problem List   Diagnosis Date Noted  . Chronic pain of left ankle 09/01/2021  . Acute irritant rhinitis 06/02/2021  . Right lumbar radiculopathy 05/05/2021  . Biceps tendinitis, left, distal 05/05/2021  . Abnormal weight gain 01/13/2021  . De Quervain's tenosynovitis, right 12/01/2020  . Erectile dysfunction 12/01/2020  . Cubital tunnel syndrome of both upper extremities 09/07/2017  . Tennis elbow 09/07/2017  . H/O: vasectomy 06/09/2016  . 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 History of PSVT (paroxysmal supraventricular tachycardia) 05/31/2016  . Bradycardia  05/31/2016  . Fatigue 05/31/2016  . Decreased libido 05/31/2016  . Weight gain 05/31/2016   Past Medical History:  Diagnosis Date  . H/O: vasectomy 06/09/2016   01/2013  . PSVT (paroxysmal supraventricular tachycardia)    Past Surgical History: No past surgical history on file. Medication List:  Current Outpatient Medications  Medication Sig Dispense Refill  . AMBULATORY NON FORMULARY MEDICATION  Continuous positive airway pressure (CPAP) machine set on AutoPAP (10-20 cmH2O), with all supplemental supplies as needed. Please provide a large size [employer] Full Face Simplus Mask. 1 each 0  . calcium-vitamin D (OSCAL WITH D) 500-5 MG-MCG tablet Take 1 tablet by mouth.    . fluticasone (FLONASE) 50 MCG/ACT nasal spray Place 2 sprays into both nostrils daily. 16 g 0  . MAGNESIUM HYDROXIDE PO Take by mouth.    . Menaquinone-7 (K2 PO) Take by mouth.    . Multiple Vitamin (MULTIVITAMIN WITH MINERALS) TABS tablet Take 1 tablet by mouth daily.    . tadalafil (CIALIS) 10 MG tablet Take 1 tablet (10 mg total) by mouth daily. 20 tablet 0  . Whey Protein POWD Take by mouth.    . Zinc Acetate, Oral, (ZINC ACETATE PO) Take by mouth.     No current facility-administered medications for this visit.   Allergies: No Known Allergies Social History: Social History   Socioeconomic History  . Marital status: Married    Spouse name: Not on file  . Number of children: Not on file  . Years of education: Not on file  . Highest education level: Not on file  Occupational History  . Not on file  Tobacco Use  . Smoking status: Former    Types: Cigarettes  . Smokeless tobacco: Never  Vaping Use  . Vaping status: Never Used  Substance and Sexual Activity  . Alcohol use: Yes    Comment: socially  . Drug use: Never  . Sexual activity: Yes    Birth control/protection: None  Other Topics Concern  . Not on file  Social History Narrative  . Not on file   Social Determinants of Health    Financial Resource Strain: Patient Declined (01/31/2023)   Overall Financial Resource Strain (CARDIA)   . Difficulty of Paying Living Expenses: Patient declined  Food Insecurity: Patient Declined (01/31/2023)   Hunger Vital Sign   . Worried About Programme researcher, broadcasting/film/video in the Last Year: Patient declined   . Ran Out of Food in the Last Year: Patient declined  Transportation Needs: Patient Declined (01/31/2023)   PRAPARE - Transportation   . Lack of Transportation (Medical): Patient declined   . Lack of Transportation (Non-Medical): Patient declined  Physical Activity: Sufficiently Active (01/31/2023)   Exercise Vital Sign   . Days of Exercise per Week: 5 days   . Minutes of Exercise per Session: 90 min  Stress: Patient Declined (01/31/2023)   [employer] of Occupational Health - Occupational Stress Questionnaire   . Feeling of Stress : Patient declined  Social Connections: Unknown (02/23/2023)   [facility]   Social Network   . Social Network: Not on file   Lives in a ***. Smoking: *** Occupation: ***  Environmental HistorySurveyor, minerals in the house: Copywriter, advertising in the family room: {Blank single:19197::"yes","no"} Carpet in the bedroom: {Blank single:19197::"yes","no"} Heating: {Blank single:19197::"electric","gas","heat pump"} Cooling: {Blank single:19197::"central","window","heat pump"} Pet: {Blank single:19197::"yes ***","no"}  Family History: Family History  Problem Relation Age of Onset  . Hypertension Mother   . Diabetes Mother   . Cancer Father   . Diabetes Maternal Grandmother   . Hyperlipidemia Maternal Grandmother   . Hypertension Maternal Grandmother   . Breast cancer Maternal Grandmother   . Hypertension Maternal Grandfather   . Heart disease Maternal Grandfather   . Skin cancer Maternal Grandfather   . Heart attack Paternal Grandmother   . Stroke Paternal Grandfather   .  Colon cancer  Maternal Aunt   . Stroke Paternal Uncle    Problem                               Relation Asthma                                   *** Eczema                                *** Food allergy                          *** Allergic rhino conjunctivitis     ***  Review of Systems  Constitutional:  Negative for appetite change, chills, fever and unexpected weight change.  HENT:  Negative for congestion and rhinorrhea.   Eyes:  Negative for itching.  Respiratory:  Negative for cough, chest tightness, shortness of breath and wheezing.   Cardiovascular:  Negative for chest pain.  Gastrointestinal:  Negative for abdominal pain.  Genitourinary:  Negative for difficulty urinating.  Skin:  Negative for rash.  Neurological:  Negative for headaches.   Objective: There were no vitals taken for this visit. There is no height or weight on file to calculate BMI. Physical Exam Vitals and nursing note reviewed.  Constitutional:      Appearance: Normal appearance. He is well-developed.  HENT:     Head: Normocephalic and atraumatic.     Right Ear: Tympanic membrane and external ear normal.     Left Ear: Tympanic membrane and external ear normal.     Nose: Nose normal.     Mouth/Throat:     Mouth: Mucous membranes are moist.     Pharynx: Oropharynx is clear.  Eyes:     Conjunctiva/sclera: Conjunctivae normal.  Cardiovascular:     Rate and Rhythm: Normal rate and regular rhythm.     Heart sounds: Normal heart sounds. No murmur heard.    No friction rub. No gallop.  Pulmonary:     Effort: Pulmonary effort is normal.     Breath sounds: Normal breath sounds. No wheezing, rhonchi or rales.  Musculoskeletal:     Cervical back: Neck supple.  Skin:    General: Skin is warm.     Findings: No rash.  Neurological:     Mental Status: He is alert and oriented to person, place, and time.  Psychiatric:        Behavior: Behavior normal.  The plan was reviewed with the patient/family, and all  questions/concerned were addressed.  It was my pleasure to see Kasch today and participate in his care. Please feel free to contact me with any questions or concerns.  Sincerely,  Wyline Mood, DO Allergy & Immunology  [facility] office: [phone] [facility] office: [phone]

## 2023-03-29 ENCOUNTER — Encounter: Payer: Self-pay | Admitting: Allergy

## 2023-03-29 ENCOUNTER — Ambulatory Visit: Payer: Managed Care, Other (non HMO) | Admitting: Allergy

## 2023-03-29 VITALS — BP 134/68 | HR 60 | Temp 98.1°F | Resp 18 | Ht 69.0 in | Wt 304.5 lb

## 2023-03-29 DIAGNOSIS — K219 Gastro-esophageal reflux disease without esophagitis: Secondary | ICD-10-CM

## 2023-03-29 DIAGNOSIS — J301 Allergic rhinitis due to pollen: Secondary | ICD-10-CM

## 2023-03-29 DIAGNOSIS — J3089 Other allergic rhinitis: Secondary | ICD-10-CM | POA: Diagnosis not present

## 2023-03-29 MED ORDER — MONTELUKAST SODIUM 10 MG PO TABS
10.0000 mg | ORAL_TABLET | Freq: Every day | ORAL | 5 refills | Status: DC
Start: 2023-03-29 — End: 2023-06-10

## 2023-03-29 MED ORDER — RYALTRIS 665-25 MCG/ACT NA SUSP: 1.0000 | Freq: Two times a day (BID) | NASAL | 5 refills | Status: DC

## 2023-03-29 NOTE — Patient Instructions (Addendum)
Today's skin testing showed: Positive to grass, ragweed, weed, trees, dust mites.   Results given.  Environmental allergies Start environmental control measures as below. Use over the counter antihistamines such as Zyrtec (cetirizine), Claritin (loratadine), Allegra (fexofenadine), or Xyzal (levocetirizine) daily as needed. May take twice a day during allergy flares. May switch antihistamines every few months. Start Singulair (montelukast) 10mg  daily at night. Cautioned that in some children/adults can experience behavioral changes including hyperactivity, agitation, depression, sleep disturbances and suicidal ideations. These side effects are rare, but if you notice them you should notify me and discontinue Singulair (montelukast).  Start Ryaltris (olopatadine + mometasone nasal spray combination) 1-2 sprays per nostril twice a day. Sample given. This replaces your other nasal sprays. If this works well for you, then have [facility] ship the medication to your home - prescription already sent in.  Nasal saline spray (i.e., Simply Saline) or nasal saline lavage (i.e., NeilMed) is recommended as needed and prior to medicated nasal sprays. Consider allergy injections for long term control if above medications do not help the symptoms - handout given.  Heartburn: See handout for lifestyle and dietary modifications. May take famotidine 20-40mg  daily as needed.   Follow up in 3 months or sooner if needed.    Reducing Pollen Exposure Pollen seasons: trees (spring), grass (summer) and ragweed/weeds (fall). Keep windows closed in your home and car to lower pollen exposure.  Install air conditioning in the bedroom and throughout the house if possible.  Avoid going out in dry windy days - especially early morning. Pollen counts are highest between 5 - 10 AM and on dry, hot and windy days.  Save outside activities for late afternoon or after a heavy rain, when pollen levels are lower.  Avoid mowing  of grass if you have grass pollen allergy. Be aware that pollen can also be transported indoors on people and pets.  Dry your clothes in an automatic dryer rather than hanging them outside where they might collect pollen.  Rinse hair and eyes before bedtime.  Control of House Dust Mite Allergen Dust mite allergens are a common trigger of allergy and asthma symptoms. While they can be found throughout the house, these microscopic creatures thrive in warm, humid environments such as bedding, upholstered furniture and carpeting. Because so much time is spent in the bedroom, it is essential to reduce mite levels there.  Encase pillows, mattresses, and box springs in special allergen-proof fabric covers or airtight, zippered plastic covers.  Bedding should be washed weekly in hot water (130 F) and dried in a hot dryer. Allergen-proof covers are available for comforters and pillows that can't be regularly washed.  Wash the allergy-proof covers every few months. Minimize clutter in the bedroom. Keep pets out of the bedroom.  Keep humidity less than 50% by using a dehumidifier or air conditioning. You can buy a humidity measuring device called a hygrometer to monitor this.  If possible, replace carpets with hardwood, linoleum, or washable area rugs. If that's not possible, vacuum frequently with a vacuum that has a HEPA filter. Remove all upholstered furniture and non-washable window drapes from the bedroom. Remove all non-washable stuffed toys from the bedroom.  Wash stuffed toys weekly.  Buffered Isotonic Saline Irrigations:  Goal: When you irrigate with the isotonic saline (salt water) it washes mucous and other debris from your nose that could be contributing to your nasal symptoms.   Recipe: Obtain 1 quart jar that is clean Fill with clean (bottled, boiled or distilled) water  Add 1-2 heaping teaspoons of salt without iodine If the solution with 2 teaspoons of salt is too strong, adjust the  amount down until better tolerated Add 1 teaspoon of [employer] baking soda (pure bicarbonate) Mix ingredients together and store at room temperature and discard after 1 week * Alternatively you can buy pre made salt packets for the NeilMed bottle or there          are other over the counter brands available  Instructions: Warm  cup of the solution in the microwave if desired but be careful not to overheat as this will burn the inside of your nose Stand over a sink (or do it while you shower) and squirt the solution into one side of your nose aiming towards the back of your head Sometimes saying "coca cola" while irrigating can be helpful to prevent fluid from going down your throat  The solution will travel to the back of your nose and then come out the other side Perform this again on the other side Try to do this twice a day If you are using a nasal spray in addition to the irrigation, irrigate first and then use the topical nasal spray otherwise you will wash the nasal spray out of your nose

## 2023-05-11 IMAGING — MR MR LUMBAR SPINE W/O CM
4 of 5 series · 25 of 48 positions shown · non-contrast
Comparison: Radiograph from 04/07/2021.

CLINICAL DATA: Initial evaluation for right-sided lower back pain
with extension into the right lower extremity to the foot.

EXAM:
MRI LUMBAR SPINE WITHOUT CONTRAST
TECHNIQUE: Multiplanar, multisequence MR imaging of the lumbar spine was
performed. No intravenous contrast was administered.

[Series 2: T2 · sagittal · 4.0mm · 0.81mm/px · 6 of 15 slices shown (1 of 2)]
[im 1/15]
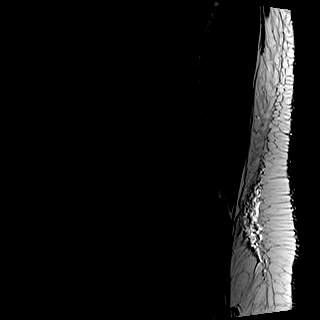
[im 3/15]
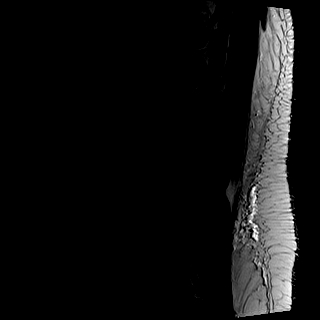
[im 6/15]
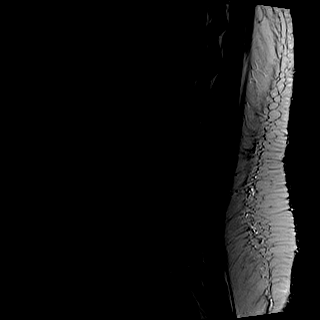
[im 9/15]
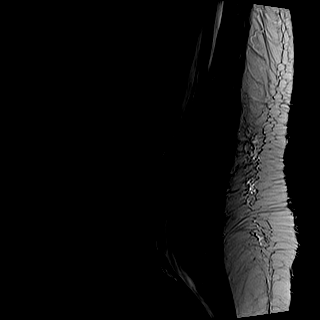
[im 12/15]
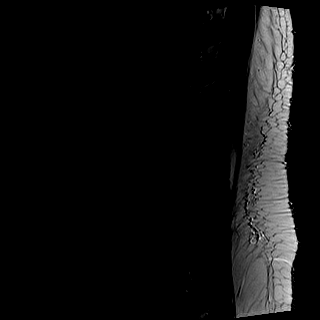
[im 15/15]
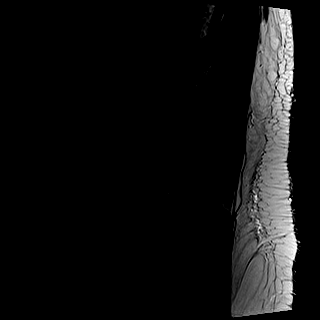

[Series 3: T1 · sagittal · 4.0mm · 0.41mm/px · 6 of 15 slices shown (1 of 2)]
[im 1/15]
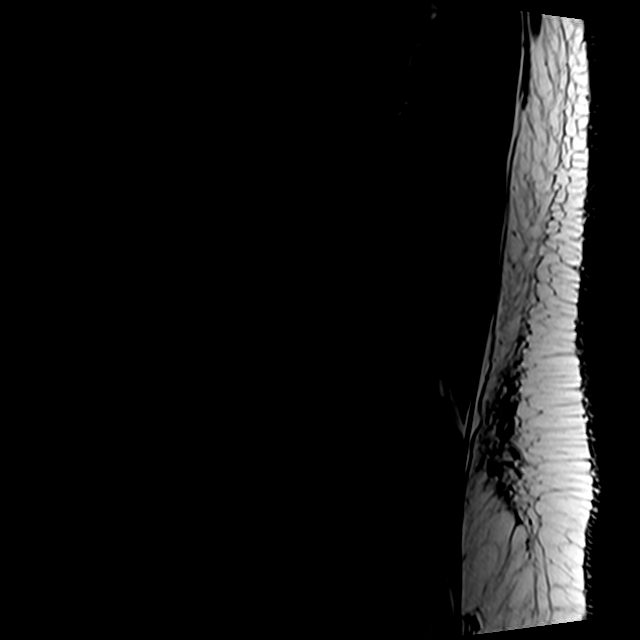
[im 3/15]
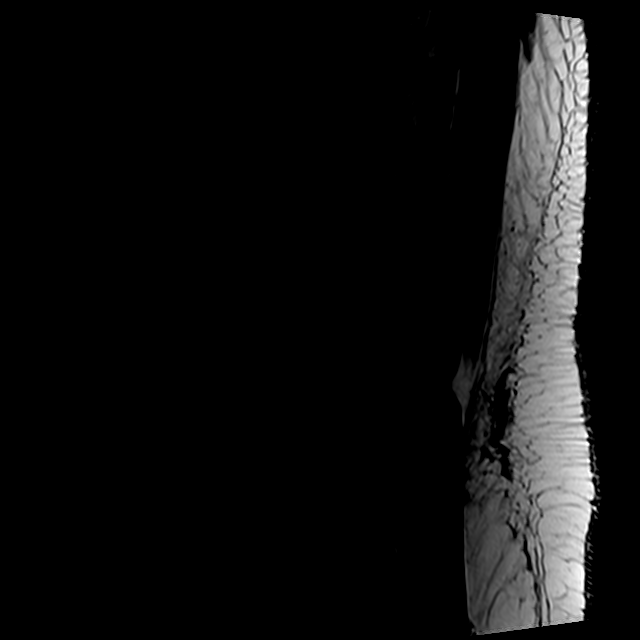
[im 6/15]
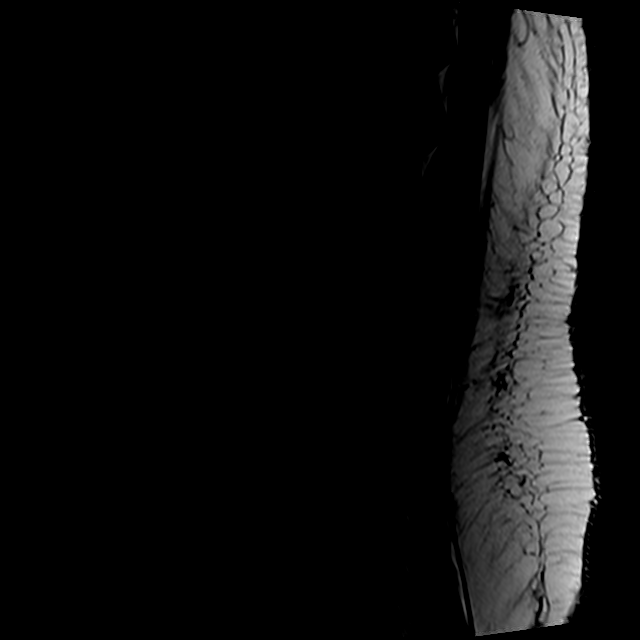
[im 9/15]
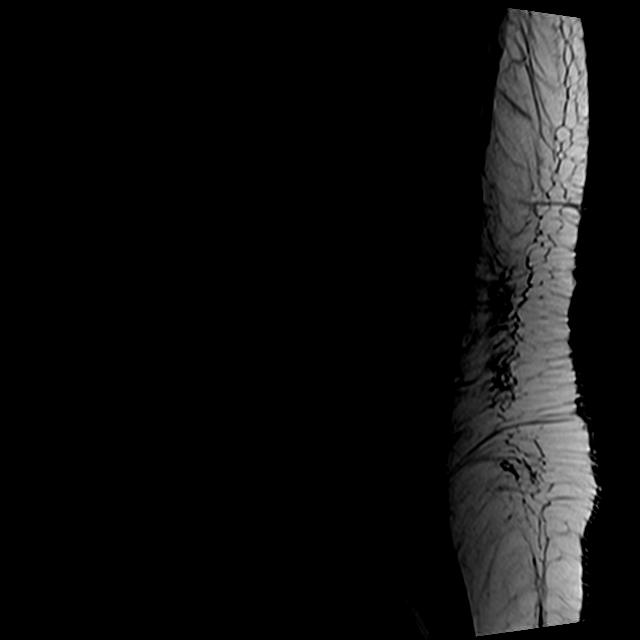
[im 12/15]
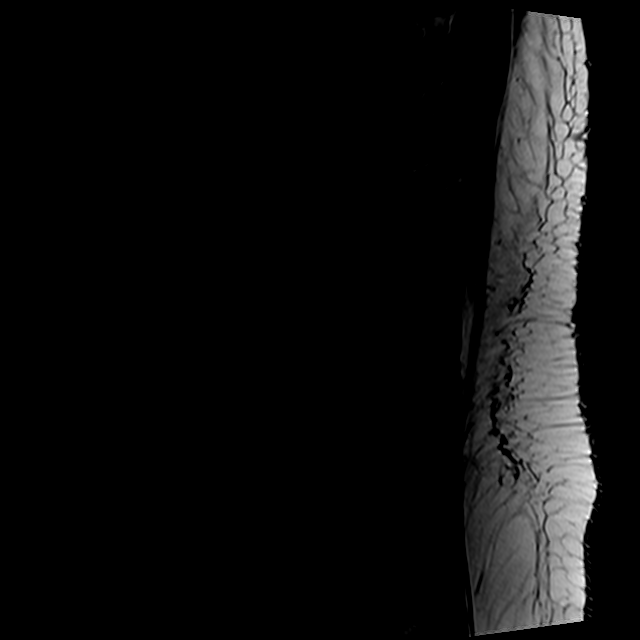
[im 15/15]
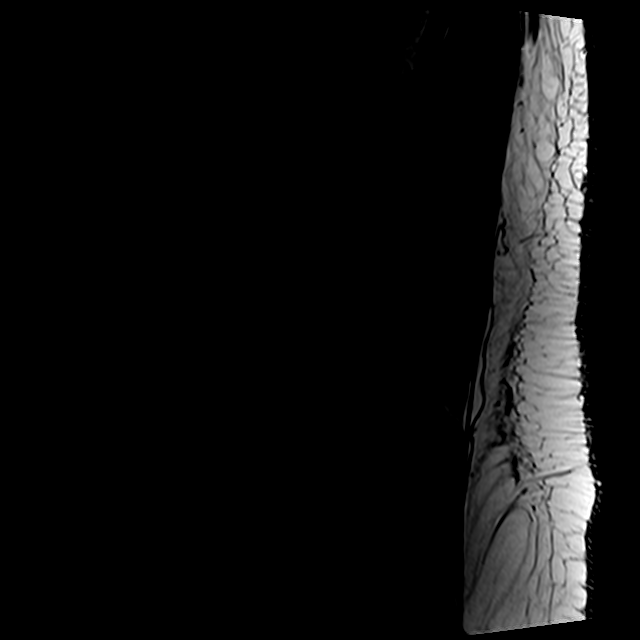

[Series 5: T2 · axial · 4.0mm · 0.78mm/px · z∈[-147,+79]mm · 9 of 41 slices shown (2 of 2)]
[im 1/41]
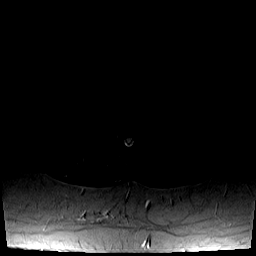
[im 6/41]
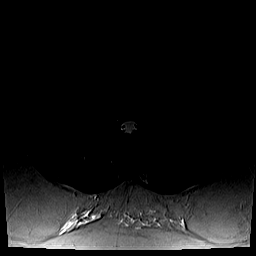
[im 12/41]
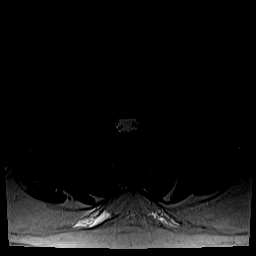
[im 18/41]
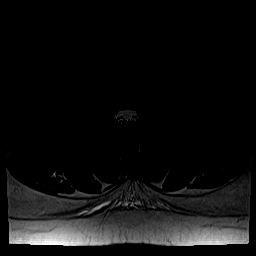
[im 21/41]
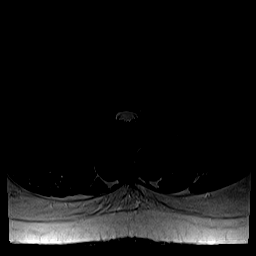
[im 23/41]
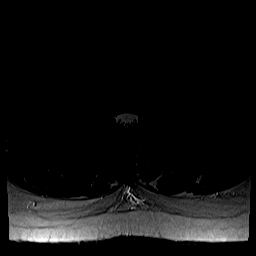
[im 29/41]
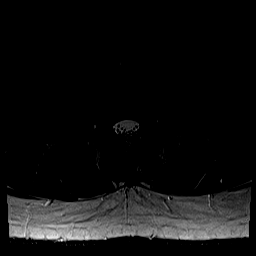
[im 35/41]
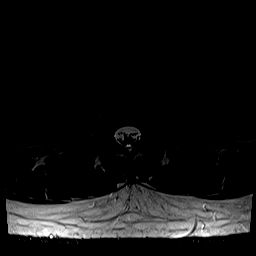
[im 41/41]
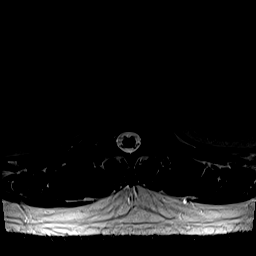

[Series 6: T1 · axial · 4.0mm · 0.39mm/px · z∈[-147,+49]mm · 4 of 41 slices shown (2 of 2)]
[im 1/41]
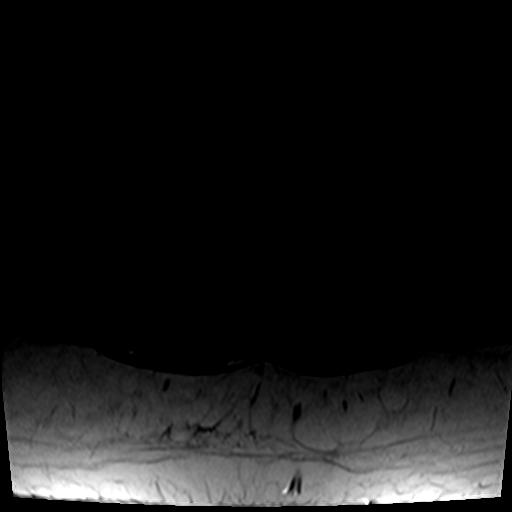
[im 6/41]
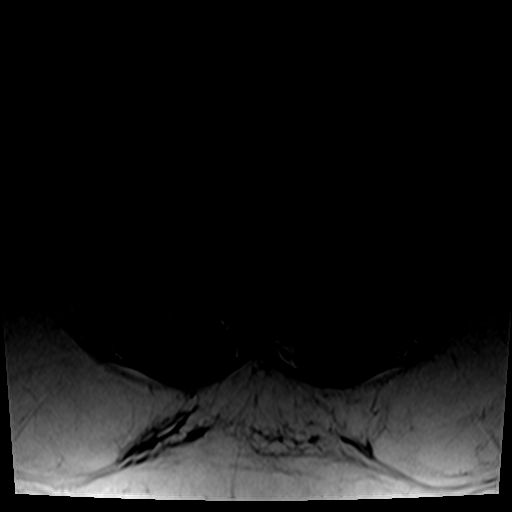
[im 21/41]
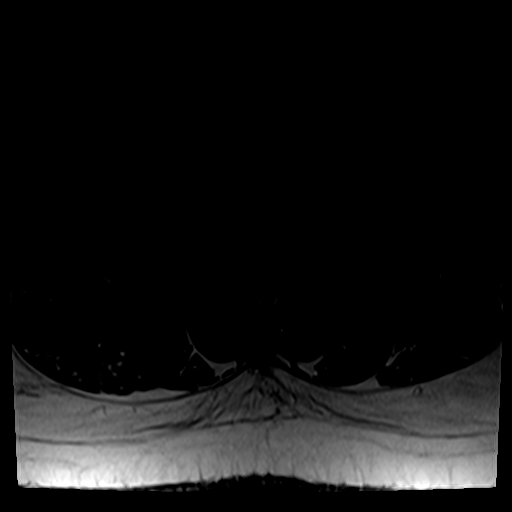
[im 35/41]
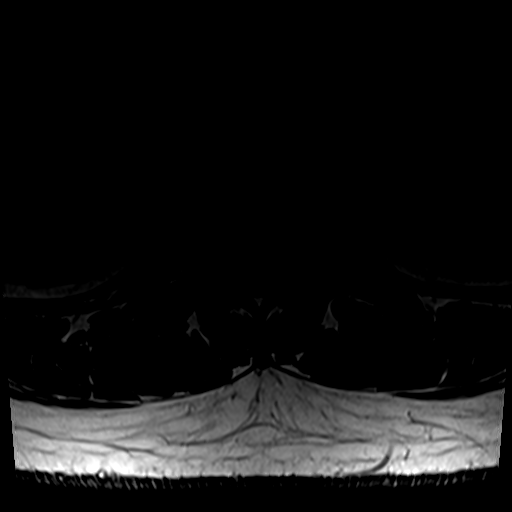

[25 of 48 positions shown; findings below may reference images not displayed]

FINDINGS: Segmentation: Standard. Lowest well-formed disc space labeled the
L5-S1 level.

Alignment: Trace retrolisthesis of L2 on L3 and L5 on S1. Alignment
otherwise normal with preservation of the normal lumbar lordosis.

Vertebrae: Vertebral body height maintained without acute or chronic
fracture. Bone marrow signal intensity within normal limits. No
discrete or worrisome osseous lesions. No abnormal marrow edema.

Conus medullaris and cauda equina: Conus extends to the L1-2 level.
Conus and cauda equina appear normal.

Paraspinal and other soft tissues: Paraspinous soft tissues within
normal limits. 1.4 cm simple cyst present within the interpolar
right kidney. Visualized visceral structures otherwise unremarkable.

Disc levels:

L1-2:  Unremarkable.

L2-3: Trace retrolisthesis with minimal annular disc bulge. No
spinal stenosis. Foramina remain patent.

L3-4: Mild annular disc bulge with disc desiccation. Disc bulging
asymmetric to the left. No spinal stenosis. Mild left greater than
right L3 foraminal narrowing.

L4-5: Disc desiccation with mild disc bulge. Minimal facet spurring.
No spinal stenosis. Foramina remain patent.

L5-S1: Disc desiccation with mild disc bulge. Associated reactive
endplate spurring. Superimposed broad-based right subarticular disc
extrusion contacts and mildly displaces the descending right S1
nerve root as it courses through the right lateral recess (series 6,
image 37). Extruded disc material with superior migration is also
seen, closely approximating and potentially affecting the exiting
right S1 nerve root as well (series 6, image 34 on axial sequence,
series 3, image 5 on sagittal sequence). The extruded disc material
appears to be sequestered sequestered. No significant canal or
lateral recess stenosis. Moderate right L5 foraminal stenosis. Left
neural foramina remains patent.
IMPRESSION: 1. Right subarticular disc extrusion with superior migration and
probable sequestration at L5-S1, contacting and/or closely
approximating both the exiting right L5 and descending S1 nerve
roots, either of which could be affected.
2. Additional mild disc bulging at L2-3 through L4-5 without
significant spinal stenosis. Mild left greater than right L3
foraminal narrowing.

## 2023-05-13 ENCOUNTER — Ambulatory Visit: Payer: Managed Care, Other (non HMO) | Admitting: Gastroenterology

## 2023-05-16 LAB — HM COLONOSCOPY

## 2023-06-06 ENCOUNTER — Encounter: Payer: Self-pay | Admitting: Medical-Surgical

## 2023-06-10 ENCOUNTER — Ambulatory Visit (INDEPENDENT_AMBULATORY_CARE_PROVIDER_SITE_OTHER): Payer: Managed Care, Other (non HMO) | Admitting: Medical-Surgical

## 2023-06-10 ENCOUNTER — Encounter: Payer: Self-pay | Admitting: Medical-Surgical

## 2023-06-10 VITALS — BP 137/71 | HR 54 | Resp 20 | Ht 69.0 in | Wt 312.6 lb

## 2023-06-10 DIAGNOSIS — N529 Male erectile dysfunction, unspecified: Secondary | ICD-10-CM | POA: Diagnosis not present

## 2023-06-10 DIAGNOSIS — Z6841 Body Mass Index (BMI) 40.0 and over, adult: Secondary | ICD-10-CM | POA: Diagnosis not present

## 2023-06-10 DIAGNOSIS — Z0000 Encounter for general adult medical examination without abnormal findings: Secondary | ICD-10-CM

## 2023-06-10 MED ORDER — TADALAFIL 10 MG PO TABS: 10.0000 mg | ORAL_TABLET | Freq: Every day | ORAL | 5 refills | Status: DC

## 2023-06-10 NOTE — Patient Instructions (Signed)

## 2023-06-10 NOTE — Progress Notes (Signed)
Complete physical exam  Patient: Kyle Duncan   DOB: Feb 16, 1976   47 y.o. Male  MRN: [id number]  Subjective:    Chief Complaint  Patient presents with   Annual Exam    Daevon Norquist is a 47 y.o. male who presents today for a complete physical exam. He reports consuming a  high protein  diet.  Does weight lifting for exercise on a regular basis.  He generally feels well. He reports sleeping well. He does not have additional problems to discuss today.   Most recent fall risk assessment:    02/02/2023    8:19 AM  Fall Risk   Falls in the past year? 0  Number falls in past yr: 0  Injury with Fall? 0  Risk for fall due to : No Fall Risks  Follow up Falls evaluation completed     Most recent depression screenings:    02/02/2023    8:20 AM 12/28/2021    1:15 PM  PHQ 2/9 Scores  PHQ - 2 Score 0 0    Vision:Within last year, Dental: No current dental problems and Receives regular dental care, and STD: The patient denies history of sexually transmitted disease.    Patient Care Team: Christen Butter, NP as PCP - General (Nurse Practitioner)   Outpatient Medications Prior to Visit  Medication Sig   AMBULATORY NON FORMULARY MEDICATION Continuous positive airway pressure (CPAP) machine set on AutoPAP (10-20 cmH2O), with all supplemental supplies as needed. Please provide a large size [employer] Full Face Simplus Mask.   calcium-vitamin D (OSCAL WITH D) 500-5 MG-MCG tablet Take 1 tablet by mouth.   MAGNESIUM HYDROXIDE PO Take by mouth.   Menaquinone-7 (K2 PO) Take by mouth.   Olopatadine-Mometasone (RYALTRIS) [id number] MCG/ACT SUSP Place 1-2 sprays into the nose in the morning and at bedtime.   Zinc Acetate, Oral, (ZINC ACETATE PO) Take by mouth.   [DISCONTINUED] tadalafil (CIALIS) 10 MG tablet Take 1 tablet (10 mg total) by mouth daily.   [DISCONTINUED] montelukast (SINGULAIR) 10 MG tablet Take 1 tablet (10 mg total) by mouth at bedtime.   [DISCONTINUED] Whey Protein POWD Take by  mouth.   No facility-administered medications prior to visit.    Review of Systems  Constitutional:  Negative for chills, fever, malaise/fatigue and weight loss.  HENT:  Negative for congestion, ear pain, hearing loss, sinus pain and sore throat.   Eyes:  Negative for blurred vision, photophobia and pain.  Respiratory:  Negative for cough, shortness of breath and wheezing.   Cardiovascular:  Negative for chest pain, palpitations and leg swelling.  Gastrointestinal:  Negative for abdominal pain, constipation, diarrhea, heartburn, nausea and vomiting.  Genitourinary:  Negative for dysuria, frequency and urgency.  Musculoskeletal:  Negative for falls and neck pain.  Skin:  Negative for itching and rash.  Neurological:  Negative for dizziness, weakness and headaches.  Endo/Heme/Allergies:  Negative for polydipsia. Does not bruise/bleed easily.  Psychiatric/Behavioral:  Negative for depression, substance abuse and suicidal ideas. The patient is not nervous/anxious.      Objective:    BP 137/71 (BP Location: Right Arm, Cuff Size: Large)   Pulse (!) 54   Resp 20   Ht 5\' 9"  (1.753 m)   Wt (!) 312 lb 9.6 oz (141.8 kg)   SpO2 98%   BMI 46.16 kg/m    Physical Exam Vitals reviewed.  Constitutional:      General: He is not in acute distress.    Appearance: Normal appearance. He is not  ill-appearing.  HENT:     Head: Normocephalic and atraumatic.     Right Ear: Tympanic membrane, ear canal and external ear normal. There is no impacted cerumen.     Left Ear: Tympanic membrane, ear canal and external ear normal. There is no impacted cerumen.     Nose: Nose normal. No congestion or rhinorrhea.     Mouth/Throat:     Mouth: Mucous membranes are moist.     Pharynx: No oropharyngeal exudate or posterior oropharyngeal erythema.  Eyes:     General: No scleral icterus.       Right eye: No discharge.        Left eye: No discharge.     Extraocular Movements: Extraocular movements intact.      Conjunctiva/sclera: Conjunctivae normal.     Pupils: Pupils are equal, round, and reactive to light.  Neck:     Thyroid: No thyromegaly.     Vascular: No carotid bruit or JVD.     Trachea: Trachea normal.  Cardiovascular:     Rate and Rhythm: Normal rate and regular rhythm.     Pulses: Normal pulses.     Heart sounds: Normal heart sounds. No murmur heard.    No friction rub. No gallop.  Pulmonary:     Effort: Pulmonary effort is normal. No respiratory distress.     Breath sounds: Normal breath sounds. No wheezing.  Abdominal:     General: Bowel sounds are normal. There is no distension.     Palpations: Abdomen is soft.     Tenderness: There is no abdominal tenderness. There is no guarding.  Musculoskeletal:        General: Normal range of motion.     Cervical back: Normal range of motion and neck supple.  Lymphadenopathy:     Cervical: No cervical adenopathy.  Skin:    General: Skin is warm and dry.  Neurological:     Mental Status: He is alert and oriented to person, place, and time.     Cranial Nerves: No cranial nerve deficit.  Psychiatric:        Mood and Affect: Mood normal.        Behavior: Behavior normal.        Thought Content: Thought content normal.        Judgment: Judgment normal.   No results found for any visits on 06/10/23.     Assessment & Plan:    Routine Health Maintenance and Physical Exam  Immunization History  Administered Date(s) Administered   Anthrax 10/17/2001, 03/31/2002, 04/16/2002, 09/25/2002, 04/02/2003, 01/30/2005, 02/24/2006, 05/28/2008, 09/27/2009   H1N1 10/27/2008   Hepatitis A, Adult 04/02/2000   Hepatitis B, ADULT 05/28/2008, 10/26/2008, 05/10/2009   Influenza Nasal 09/29/2003, 06/26/2006, 06/24/2007   Influenza Split 05/23/2016   Influenza Whole 06/26/2000, 07/29/2001, 06/16/2002   Influenza,inj,Quad PF,6+ Mos 05/31/2016, 07/01/2017, 08/28/2018   Influenza-Unspecified 06/27/2004, 07/25/2005, 06/17/2008, 05/10/2009, 10/15/2014,  06/23/2017   MMR 04/12/1989, 02/19/1992   Meningococcal polysaccharide vaccine (MPSV4) 12/27/1999, 12/26/2004   OPV 02/19/1992   Smallpox 04/27/2002   Td 02/19/1992, 10/15/2001, 10/21/2009   Td (Adult), 2 Lf Tetanus Toxid, Preservative Free 02/19/1992, 10/15/2001   Td (Adult),unspecified 10/21/2009   Tdap 05/23/2014, 03/10/2017   Tetanus 10/21/2009   Typhoid Inactivated 01/27/2008   Typhoid Parenteral 07/25/1999, 07/29/2001, 10/26/2003, 12/04/2005   Yellow Fever 12/27/1999    Health Maintenance  Topic Date Due   INFLUENZA VACCINE  11/21/2023 (Originally 03/24/2023)   Hepatitis C Screening  06/09/2024 (Originally 08/30/1992)   HIV Screening  06/09/2024 (Originally 08/30/1989)   DTaP/Tdap/Td (9 - Td or Tdap) 03/11/2027   Colonoscopy  05/15/2033   HPV VACCINES  Aged Out   COVID-19 Vaccine  Discontinued    Discussed health benefits of physical activity, and encouraged him to engage in regular exercise appropriate for his age and condition.  1. Annual physical exam Reviewed recent labs.  Deferring further evaluation today.  Up-to-date on preventative care.  Wellness information provided with AVS.  Form completed for his employer and provided to patient.  2. BMI 40.0-44.9, adult (HCC) Continued to discuss his current weight.  He is a heavy lifter and has a large amount of muscle mass so his BMI is not reflective of this actual obesity concerns.  Discussed healthy diet and continuing regular intentional exercise.  3. Erectile dysfunction, unspecified erectile dysfunction type Refilling tadalafil for as needed use. - tadalafil (CIALIS) 10 MG tablet; Take 1 tablet (10 mg total) by mouth daily.  Dispense: 20 tablet; Refill: 5  Return in about 1 year (around 06/09/2024) for annual physical exam.   Christen Butter, NP

## 2023-06-24 ENCOUNTER — Encounter: Payer: Self-pay | Admitting: Emergency Medicine

## 2023-06-24 ENCOUNTER — Ambulatory Visit
Admission: EM | Admit: 2023-06-24 | Discharge: 2023-06-24 | Disposition: A | Discharge status: Home / Self Care | Payer: Managed Care, Other (non HMO) | Attending: Family Medicine | Admitting: Family Medicine

## 2023-06-24 DIAGNOSIS — R059 Cough, unspecified: Secondary | ICD-10-CM | POA: Diagnosis not present

## 2023-06-24 DIAGNOSIS — J0100 Acute maxillary sinusitis, unspecified: Secondary | ICD-10-CM

## 2023-06-24 DIAGNOSIS — J309 Allergic rhinitis, unspecified: Secondary | ICD-10-CM

## 2023-06-24 MED ORDER — PREDNISONE 10 MG (21) PO TBPK: ORAL_TABLET | Freq: Every day | ORAL | 0 refills | Status: DC

## 2023-06-24 MED ORDER — AMOXICILLIN-POT CLAVULANATE 875-125 MG PO TABS: 1.0000 | ORAL_TABLET | Freq: Two times a day (BID) | ORAL | 0 refills | Status: AC

## 2023-06-24 MED ORDER — FEXOFENADINE HCL 180 MG PO TABS: 180.0000 mg | ORAL_TABLET | Freq: Every day | ORAL | 0 refills | Status: DC

## 2023-06-24 NOTE — ED Triage Notes (Signed)
Patient c/o nasal drainage, productive cough, right ear pain, watery eyes x 3 days.  Patient has taken Dayquil and Mucinex.

## 2023-06-24 NOTE — ED Provider Notes (Signed)
[facility]    CSN: [id number] Arrival date & time: 06/24/23  0805      History   Chief Complaint Chief Complaint  Patient presents with   URI    HPI Kyle Duncan is a 47 y.o. male.   HPI Pleasant 47 year old male presents with nasal drainage, productive cough of right ear pain and watery eyes for 3 days.  Reports taking OTC DayQuil and Mucinex.  PMH significant for PSVT, morbid obesity, De Quervain's tenosynovitis of right wrist, and right lumbar radiculopathy.  Past Medical History:  Diagnosis Date   H/O: vasectomy 06/09/2016   01/2013   PSVT (paroxysmal supraventricular tachycardia) [facility])     Patient Active Problem List   Diagnosis Date Noted   BMI 40.0-44.9, adult (HCC) 06/10/2023   Chronic pain of left ankle 09/01/2021   Acute irritant rhinitis 06/02/2021   Right lumbar radiculopathy 05/05/2021   Biceps tendinitis, left, distal 05/05/2021   Abnormal weight gain 01/13/2021   De Quervain's tenosynovitis, right 12/01/2020   Erectile dysfunction 12/01/2020   Cubital tunnel syndrome of both upper extremities 09/07/2017   Tennis elbow 09/07/2017   H/O: vasectomy 06/09/2016   Decreased testosterone level 06/03/2016   History of PSVT (paroxysmal supraventricular tachycardia) 05/31/2016   Bradycardia 05/31/2016   Fatigue 05/31/2016   Decreased libido 05/31/2016   Weight gain 05/31/2016    History reviewed. No pertinent surgical history.     Home Medications    Prior to Admission medications   Medication Sig Start Date End Date Taking? Authorizing Provider  AMBULATORY NON FORMULARY MEDICATION Continuous positive airway pressure (CPAP) machine set on AutoPAP (10-20 cmH2O), with all supplemental supplies as needed. Please provide a large size [employer] Full Face Simplus Mask. 06/02/22  Yes Christen Butter, NP  calcium-vitamin D (OSCAL WITH D) 500-5 MG-MCG tablet Take 1 tablet by mouth.   Yes [provider]  MAGNESIUM HYDROXIDE PO Take by  mouth.   Yes [provider]  Menaquinone-7 (K2 PO) Take by mouth.   Yes [provider]  Olopatadine-Mometasone (Cristal Generous) [phone] MCG/ACT SUSP Place 1-2 sprays into the nose in the morning and at bedtime. 03/29/23  Yes Ellamae Sia, DO  tadalafil (CIALIS) 10 MG tablet Take 1 tablet (10 mg total) by mouth daily. 06/10/23  Yes Christen Butter, NP  Zinc Acetate, Oral, (ZINC ACETATE PO) Take by mouth.   Yes [provider]  amoxicillin-clavulanate (AUGMENTIN) 875-125 MG tablet Take 1 tablet by mouth 2 (two) times daily for 10 days. 06/24/23 07/04/23 Yes Trevor Iha, FNP  fexofenadine [facility] ALLERGY) 180 MG tablet Take 1 tablet (180 mg total) by mouth daily for 15 days. 06/24/23 07/09/23 Yes Trevor Iha, FNP  predniSONE (STERAPRED UNI-PAK 21 TAB) 10 MG (21) TBPK tablet Take by mouth daily. Take 6 tabs by mouth daily  for 2 days, then 5 tabs for 2 days, then 4 tabs for 2 days, then 3 tabs for 2 days, 2 tabs for 2 days, then 1 tab by mouth daily for 2 days 06/24/23  Yes Trevor Iha, FNP    Family History Family History  Problem Relation Age of Onset   Hypertension Mother    Diabetes Mother    Cancer Father    Colon cancer Maternal Aunt    Stroke Paternal Uncle    Diabetes Maternal Grandmother    Hyperlipidemia Maternal Grandmother    Hypertension Maternal Grandmother    Breast cancer Maternal Grandmother    Hypertension Maternal Grandfather    Heart disease Maternal  Grandfather    Skin cancer Maternal Grandfather    Heart attack Paternal Grandmother    Stroke Paternal Grandfather    Allergic rhinitis Neg Hx    Asthma Neg Hx    Eczema Neg Hx    Urticaria Neg Hx     Social History Social History   Tobacco Use   Smoking status: Former    Types: Cigarettes   Smokeless tobacco: Never  Vaping Use   Vaping status: Never Used  Substance Use Topics   Alcohol use: Yes    Comment: socially   Drug use: Never     Allergies   Patient has no known  allergies.   Review of Systems Review of Systems  HENT:  Positive for congestion and ear pain.   Respiratory:  Positive for cough.   All other systems reviewed and are negative.    Physical Exam Triage Vital Signs ED Triage Vitals  Encounter Vitals Group     BP 06/24/23 0829 132/81     Systolic BP Percentile --      Diastolic BP Percentile --      Pulse Rate 06/24/23 0829 60     Resp 06/24/23 0829 18     Temp 06/24/23 0829 98 F (36.7 C)     Temp Source 06/24/23 0829 Oral     SpO2 06/24/23 0829 96 %     Weight 06/24/23 0830 300 lb (136.1 kg)     Height 06/24/23 0830 5\' 9"  (1.753 m)     Head Circumference --      Peak Flow --      Pain Score 06/24/23 0830 0     Pain Loc --      Pain Education --      Exclude from Growth Chart --    No data found.  Updated Vital Signs BP 132/81 (BP Location: Right Arm)   Pulse 60   Temp 98 F (36.7 C) (Oral)   Resp 18   Ht 5\' 9"  (1.753 m)   Wt 300 lb (136.1 kg)   SpO2 96%   BMI 44.30 kg/m    Physical Exam Vitals and nursing note reviewed.  Constitutional:      Appearance: Normal appearance. He is obese. He is ill-appearing.  HENT:     Head: Normocephalic and atraumatic.     Right Ear: Tympanic membrane and external ear normal.     Left Ear: Tympanic membrane and external ear normal.     Ears:     Comments: Significant eustachian tube dysfunction noted bilaterally    Nose:     Comments: Turbinates are erythematous/edematous    Mouth/Throat:     Mouth: Mucous membranes are moist.     Pharynx: Oropharynx is clear.     Comments: Moderate amount of clear drainage of posterior oropharynx noted Eyes:     Extraocular Movements: Extraocular movements intact.     Conjunctiva/sclera: Conjunctivae normal.     Pupils: Pupils are equal, round, and reactive to light.  Cardiovascular:     Rate and Rhythm: Normal rate and regular rhythm.     Pulses: Normal pulses.     Heart sounds: Normal heart sounds.  Pulmonary:     Effort:  Pulmonary effort is normal.     Breath sounds: Normal breath sounds. No wheezing, rhonchi or rales.     Comments: Infrequent nonproductive cough noted on exam Musculoskeletal:        General: Normal range of motion.     Cervical back:  Normal range of motion and neck supple.  Skin:    General: Skin is warm and dry.  Neurological:     General: No focal deficit present.     Mental Status: He is alert and oriented to person, place, and time. Mental status is at baseline.  Psychiatric:        Mood and Affect: Mood normal.        Behavior: Behavior normal.      UC Treatments / Results  Labs (all labs ordered are listed, but only abnormal results are displayed) Labs Reviewed - No data to display  EKG   Radiology No results found.  Procedures Procedures (including critical care time)  Medications Ordered in UC Medications - No data to display  Initial Impression / Assessment and Plan / UC Course  I have reviewed the triage vital signs and the nursing notes.  Pertinent labs & imaging results that were available during my care of the patient were reviewed by me and considered in my medical decision making (see chart for details).     MDM: 1.  Acute maxillary sinusitis, recurrence not specified-Rx'd Augmentin 875/125 mg tablet: Take 1 tablet twice daily x 10 days; 2.  Cough, unspecified type-Rx Sterapred Unipak (tapering from 60 mg to 10 mg over 10 days); 3.  Allergic rhinitis, unspecified seasonality, unspecified trigger-Rx'd Allegra 180 mg fexofenadine daily x 5 days, as needed. Advised patient to take medications as directed with food to completion.  Advised patient to take prednisone and Allegra with first dose of Augmentin for the next 10 days.  Advised may discontinue Allegra after 5 days and use as needed for concurrent postnasal drainage/drip.  Encouraged increase daily water intake to 64 ounces per day while taking these medications.  Advised if symptoms worsen and/or  unresolved please follow-up with PCP or here for further evaluation.  Patient discharged home, hemodynamically stable. Final Clinical Impressions(s) / UC Diagnoses   Final diagnoses:  Acute maxillary sinusitis, recurrence not specified  Cough, unspecified type  Allergic rhinitis, unspecified seasonality, unspecified trigger     Discharge Instructions      Advised patient to take medications as directed with food to completion.  Advised patient to take prednisone and Allegra with first dose of Augmentin for the next 10 days.  Advised may discontinue Allegra after 5 days and use as needed for concurrent postnasal drainage/drip.  Encouraged increase daily water intake to 64 ounces per day while taking these medications.  Advised if symptoms worsen and/or unresolved please follow-up with PCP or here for further evaluation.     ED Prescriptions     Medication Sig Dispense Auth. Provider   amoxicillin-clavulanate (AUGMENTIN) 875-125 MG tablet Take 1 tablet by mouth 2 (two) times daily for 10 days. 20 tablet Trevor Iha, FNP   predniSONE (STERAPRED UNI-PAK 21 TAB) 10 MG (21) TBPK tablet Take by mouth daily. Take 6 tabs by mouth daily  for 2 days, then 5 tabs for 2 days, then 4 tabs for 2 days, then 3 tabs for 2 days, 2 tabs for 2 days, then 1 tab by mouth daily for 2 days 42 tablet Trevor Iha, FNP   fexofenadine [facility] ALLERGY) 180 MG tablet Take 1 tablet (180 mg total) by mouth daily for 15 days. 15 tablet Trevor Iha, FNP      PDMP not reviewed this encounter.   Trevor Iha, FNP 06/24/23 [phone]

## 2023-06-24 NOTE — Discharge Instructions (Addendum)
Advised patient to take medications as directed with food to completion.  Advised patient to take prednisone and Allegra with first dose of Augmentin for the next 10 days.  Advised may discontinue Allegra after 5 days and use as needed for concurrent postnasal drainage/drip.  Encouraged increase daily water intake to 64 ounces per day while taking these medications.  Advised if symptoms worsen and/or unresolved please follow-up with PCP or here for further evaluation.

## 2023-06-28 ENCOUNTER — Telehealth: Payer: Self-pay

## 2023-06-28 ENCOUNTER — Other Ambulatory Visit: Payer: Self-pay

## 2023-06-28 ENCOUNTER — Encounter: Payer: Self-pay | Admitting: Allergy

## 2023-06-28 ENCOUNTER — Ambulatory Visit: Payer: Managed Care, Other (non HMO) | Admitting: Allergy

## 2023-06-28 VITALS — BP 128/86 | HR 54 | Temp 98.1°F | Resp 18 | Ht 69.0 in | Wt 316.8 lb

## 2023-06-28 DIAGNOSIS — J3089 Other allergic rhinitis: Secondary | ICD-10-CM

## 2023-06-28 DIAGNOSIS — K219 Gastro-esophageal reflux disease without esophagitis: Secondary | ICD-10-CM

## 2023-06-28 DIAGNOSIS — B999 Unspecified infectious disease: Secondary | ICD-10-CM | POA: Diagnosis not present

## 2023-06-28 DIAGNOSIS — J301 Allergic rhinitis due to pollen: Secondary | ICD-10-CM | POA: Diagnosis not present

## 2023-06-28 NOTE — Progress Notes (Signed)
Follow Up Note  RE: Kyle Duncan MRN: [id number] DOB: 06-01-1976 Date of Office Visit: 06/28/2023  Referring provider: Christen Butter, NP Primary care provider: Christen Butter, NP  Chief Complaint: Sinus Problem (Constant sinus infections and respiratory infection - started Friday last week currently on prednisone and an antibiotic, and allegra )  History of Present Illness: I had the pleasure of seeing Cedric Mcclaine for a follow up visit at the [facility] on 06/28/2023. He is a 47 y.o. male, who is being followed for allergic rhinitis and GERD. His previous allergy office visit was on 03/29/2023 with Dr. Selena Batten. Today is a regular follow up visit.  Discussed the use of AI scribe software for clinical note transcription with the patient, who gave verbal consent to proceed.  The patient, with a history of allergies, presents with difficulty distinguishing between allergy symptoms and sinus infection. He reports an itchy, scratchy sensation behind the eyes, which then turns into a sinus infection. He has been using Ryaltris nasal spray once daily and taking Zyrtec for symptom management. However, he reports occasional drainage and frequent sinus infections requiring antibiotics and steroids, occurring every couple of months. No prior ENT visit. Did not try nettipot or Singulair.  The patient also uses a CPAP machine without a humidifier due to concerns about mold, which he has been using for about a year. He suspects the CPAP machine may be contributing to his sinus issues.  In addition to his sinus and allergy issues, the patient reports frequent heartburn, which he manages with Pepcid and milk as needed. He expresses frustration with the frequency and severity of his symptoms, particularly given that he has quit smoking over seven years ago.   Assessment and Plan: Dylan is a 47 y.o. male with: Allergic rhinitis due to dust mite Seasonal allergic rhinitis due to  pollen Past history - perennial rhinitis symptoms which flared the past year.  Tried Zyrtec and Flonase with some benefit.  No prior ENT evaluation.  2 dogs and 1 rabbit at home. 2024 skin testing positive to grass, ragweed, weed, trees, dust mites.  Interim history - didn't try Singulair or nettipot. No change in symptoms. Can't commit to AIT. Continue environmental control measures as below. Use over the counter antihistamines such as Zyrtec (cetirizine), Claritin (loratadine), Allegra (fexofenadine), or Xyzal (levocetirizine) daily as needed. May take twice a day during allergy flares. May switch antihistamines every few months. Start Singulair (montelukast) 10mg  daily at night. Cautioned that in some children/adults can experience behavioral changes including hyperactivity, agitation, depression, sleep disturbances and suicidal ideations. These side effects are rare, but if you notice them you should notify me and discontinue Singulair (montelukast). Continue Ryaltris (olopatadine + mometasone nasal spray combination) 1-2 sprays per nostril twice a day.  Nasal saline spray (i.e., Simply Saline) or nasal saline lavage (i.e., NeilMed) is recommended as needed and prior to medicated nasal sprays. Consider allergy injections for long term control if above medications do not help the symptoms. Refer to ENT.   Recurrent infections Reports frequent sinus infections requiring antibiotics and steroids. Discussed the potential role of underlying allergies and possible structural issues contributing to recurrent infections. Finish antibiotics and prednisone as prescribed.  Keep track of infections and antibiotics use. Get bloodwork to look at immune system.   Gastroesophageal reflux disease, unspecified whether esophagitis present Continue lifestyle and dietary modifications. May take famotidine 20-40mg  daily as needed.   Return in about 3 months (around 09/28/2023).  No orders of  the defined types  were placed in this encounter.  Lab Orders         CBC with Differential/Platelet         IgG, IgA, IgM         Strep pneumoniae 23 Serotypes IgG         Complement, total         Diphtheria / Tetanus Antibody Panel      Diagnostics: None.   Medication List:  Current Outpatient Medications  Medication Sig Dispense Refill   AMBULATORY NON FORMULARY MEDICATION Continuous positive airway pressure (CPAP) machine set on AutoPAP (10-20 cmH2O), with all supplemental supplies as needed. Please provide a large size Fisher[employer]Paykel Full Face Simplus Mask. 1 each 0   amoxicillin-clavulanate (AUGMENTIN) 875-125 MG tablet Take 1 tablet by mouth 2 (two) times daily for 10 days. 20 tablet 0   calcium-vitamin D (OSCAL WITH D) 500-5 MG-MCG tablet Take 1 tablet by mouth.     fexofenadine (ALLEGRA ALLERGY) 180 MG tablet Take 1 tablet (180 mg total) by mouth daily for 15 days. 15 tablet 0   MAGNESIUM HYDROXIDE PO Take by mouth.     Menaquinone-7 (K2 PO) Take by mouth.     Olopatadine-Mometasone (RYALTRIS) X543819 MCG/ACT SUSP Place 1-2 sprays into the nose in the morning and at bedtime. 29 g 5   predniSONE (STERAPRED UNI-PAK 21 TAB) 10 MG (21) TBPK tablet Take by mouth daily. Take 6 tabs by mouth daily  for 2 days, then 5 tabs for 2 days, then 4 tabs for 2 days, then 3 tabs for 2 days, 2 tabs for 2 days, then 1 tab by mouth daily for 2 days 42 tablet 0   tadalafil (CIALIS) 10 MG tablet Take 1 tablet (10 mg total) by mouth daily. 20 tablet 5   Zinc Acetate, Oral, (ZINC ACETATE PO) Take by mouth.     No current facility-administered medications for this visit.   Allergies: No Known Allergies I reviewed his past medical history, social history, family history, and environmental history and no significant changes have been reported from his previous visit.  Review of Systems  Constitutional:  Negative for appetite change, chills, fever and unexpected weight change.  HENT:  Positive for congestion, postnasal  drip, rhinorrhea and sneezing.   Eyes:  Negative for itching.  Respiratory:  Negative for cough, chest tightness, shortness of breath and wheezing.   Cardiovascular:  Negative for chest pain.  Gastrointestinal:  Negative for abdominal pain.  Genitourinary:  Negative for difficulty urinating.  Skin:  Negative for rash.  Allergic/Immunologic: Positive for environmental allergies.  Neurological:  Negative for headaches.    Objective: BP 128/86   Pulse (!) 54   Temp 98.1 F (36.7 C)   Resp 18   Ht 5\' 9"  (1.753 m)   Wt (!) 316 lb 12.8 oz (143.7 kg)   SpO2 95%   BMI 46.78 kg/m  Body mass index is 46.78 kg/m. Physical Exam Vitals and nursing note reviewed.  Constitutional:      Appearance: Normal appearance. He is well-developed.  HENT:     Head: Normocephalic and atraumatic.     Right Ear: Tympanic membrane and external ear normal.     Left Ear: Tympanic membrane and external ear normal.     Nose: Congestion and rhinorrhea present.     Comments: On right side    Mouth/Throat:     Mouth: Mucous membranes are moist.     Pharynx: Oropharynx is clear.  Eyes:  Conjunctiva/sclera: Conjunctivae normal.  Cardiovascular:     Rate and Rhythm: Normal rate and regular rhythm.     Heart sounds: Normal heart sounds. No murmur heard.    No friction rub. No gallop.  Pulmonary:     Effort: Pulmonary effort is normal.     Breath sounds: Normal breath sounds. No wheezing, rhonchi or rales.  Musculoskeletal:     Cervical back: Neck supple.  Skin:    General: Skin is warm.     Findings: No rash.  Neurological:     Mental Status: He is alert and oriented to person, place, and time.  Psychiatric:        Behavior: Behavior normal.    Previous notes and tests were reviewed. The plan was reviewed with the patient/family, and all questions/concerned were addressed.  It was my pleasure to see Kirk today and participate in his care. Please feel free to contact me with any questions or  concerns.  Sincerely,  Wyline Mood, DO Allergy & Immunology  [facility] office: [phone] [facility] office: [phone]

## 2023-06-28 NOTE — Telephone Encounter (Signed)
Please refer patient to ENT for sinusitis per Dr. Selena Batten.

## 2023-06-28 NOTE — Patient Instructions (Addendum)
Finish antibiotics and prednisone as prescribed.   Environmental allergies 2024 skin testing positive to grass, ragweed, weed, trees, dust mites.  Continue environmental control measures as below. Use over the counter antihistamines such as Zyrtec (cetirizine), Claritin (loratadine), Allegra (fexofenadine), or Xyzal (levocetirizine) daily as needed. May take twice a day during allergy flares. May switch antihistamines every few months. Start Singulair (montelukast) 10mg  daily at night. Cautioned that in some children/adults can experience behavioral changes including hyperactivity, agitation, depression, sleep disturbances and suicidal ideations. These side effects are rare, but if you notice them you should notify me and discontinue Singulair (montelukast). Continue Ryaltris (olopatadine + mometasone nasal spray combination) 1-2 sprays per nostril twice a day.  Nasal saline spray (i.e., Simply Saline) or nasal saline lavage (i.e., NeilMed) is recommended as needed and prior to medicated nasal sprays. Consider allergy injections for long term control if above medications do not help the symptoms. Refer to ENT.   Heartburn: Continue lifestyle and dietary modifications. May take famotidine 20-40mg  daily as needed.   Infections Keep track of infections and antibiotics use. Get bloodwork to look at immune system.   Follow up in 3 months or sooner if needed.    Reducing Pollen Exposure Pollen seasons: trees (spring), grass (summer) and ragweed/weeds (fall). Keep windows closed in your home and car to lower pollen exposure.  Install air conditioning in the bedroom and throughout the house if possible.  Avoid going out in dry windy days - especially early morning. Pollen counts are highest between 5 - 10 AM and on dry, hot and windy days.  Save outside activities for late afternoon or after a heavy rain, when pollen levels are lower.  Avoid mowing of grass if you have grass pollen allergy. Be  aware that pollen can also be transported indoors on people and pets.  Dry your clothes in an automatic dryer rather than hanging them outside where they might collect pollen.  Rinse hair and eyes before bedtime.  Control of House Dust Mite Allergen Dust mite allergens are a common trigger of allergy and asthma symptoms. While they can be found throughout the house, these microscopic creatures thrive in warm, humid environments such as bedding, upholstered furniture and carpeting. Because so much time is spent in the bedroom, it is essential to reduce mite levels there.  Encase pillows, mattresses, and box springs in special allergen-proof fabric covers or airtight, zippered plastic covers.  Bedding should be washed weekly in hot water (130 F) and dried in a hot dryer. Allergen-proof covers are available for comforters and pillows that can't be regularly washed.  Wash the allergy-proof covers every few months. Minimize clutter in the bedroom. Keep pets out of the bedroom.  Keep humidity less than 50% by using a dehumidifier or air conditioning. You can buy a humidity measuring device called a hygrometer to monitor this.  If possible, replace carpets with hardwood, linoleum, or washable area rugs. If that's not possible, vacuum frequently with a vacuum that has a HEPA filter. Remove all upholstered furniture and non-washable window drapes from the bedroom. Remove all non-washable stuffed toys from the bedroom.  Wash stuffed toys weekly.  Buffered Isotonic Saline Irrigations:  Goal: When you irrigate with the isotonic saline (salt water) it washes mucous and other debris from your nose that could be contributing to your nasal symptoms.   Recipe: Obtain 1 quart jar that is clean Fill with clean (bottled, boiled or distilled) water Add 1-2 heaping teaspoons of salt without iodine If the  solution with 2 teaspoons of salt is too strong, adjust the amount down until better tolerated Add 1 teaspoon  of [employer] baking soda (pure bicarbonate) Mix ingredients together and store at room temperature and discard after 1 week * Alternatively you can buy pre made salt packets for the NeilMed bottle or there          are other over the counter brands available  Instructions: Warm  cup of the solution in the microwave if desired but be careful not to overheat as this will burn the inside of your nose Stand over a sink (or do it while you shower) and squirt the solution into one side of your nose aiming towards the back of your head Sometimes saying "coca cola" while irrigating can be helpful to prevent fluid from going down your throat  The solution will travel to the back of your nose and then come out the other side Perform this again on the other side Try to do this twice a day If you are using a nasal spray in addition to the irrigation, irrigate first and then use the topical nasal spray otherwise you will wash the nasal spray out of your nose

## 2023-07-07 LAB — CBC WITH DIFFERENTIAL/PLATELET
Basophils Absolute: 0 10*3/uL (ref 0.0–0.2)
Basos: 0 %
EOS (ABSOLUTE): 0 10*3/uL (ref 0.0–0.4)
Eos: 0 %
Hematocrit: 46.6 % (ref 37.5–51.0)
Hemoglobin: 15.9 g/dL (ref 13.0–17.7)
Immature Grans (Abs): 0.1 10*3/uL (ref 0.0–0.1)
Immature Granulocytes: 1 %
Lymphocytes Absolute: 2 10*3/uL (ref 0.7–3.1)
Lymphs: 19 %
MCH: 32.1 pg (ref 26.6–33.0)
MCHC: 34.1 g/dL (ref 31.5–35.7)
MCV: 94 fL (ref 79–97)
Monocytes Absolute: 0.5 10*3/uL (ref 0.1–0.9)
Monocytes: 5 %
Neutrophils Absolute: 7.6 10*3/uL — ABNORMAL HIGH (ref 1.4–7.0)
Neutrophils: 75 %
Platelets: 307 10*3/uL (ref 150–450)
RBC: 4.96 x10E6/uL (ref 4.14–5.80)
RDW: 12.2 % (ref 11.6–15.4)
WBC: 10.2 10*3/uL (ref 3.4–10.8)

## 2023-07-07 LAB — STREP PNEUMONIAE 23 SEROTYPES IGG
Pneumo Ab Type 1*: <0.1 ug/mL — ABNORMAL LOW (ref >1.3)
Pneumo Ab Type 12 (12F)*: <0.1 ug/mL — ABNORMAL LOW (ref >1.3)
Pneumo Ab Type 14*: 2.8 ug/mL (ref >1.3)
Pneumo Ab Type 17 (17F)*: <0.1 ug/mL — ABNORMAL LOW (ref >1.3)
Pneumo Ab Type 19 (19F)*: 0.3 ug/mL — ABNORMAL LOW (ref >1.3)
Pneumo Ab Type 2*: <0.2 ug/mL — ABNORMAL LOW (ref >1.3)
Pneumo Ab Type 20*: <0.2 ug/mL — ABNORMAL LOW (ref >1.3)
Pneumo Ab Type 22 (22F)*: <0.1 ug/mL — ABNORMAL LOW (ref >1.3)
Pneumo Ab Type 23 (23F)*: <0.1 ug/mL — ABNORMAL LOW (ref >1.3)
Pneumo Ab Type 26 (6B)*: <0.1 ug/mL — ABNORMAL LOW (ref >1.3)
Pneumo Ab Type 3*: <0.1 ug/mL — ABNORMAL LOW (ref >1.3)
Pneumo Ab Type 34 (10A)*: <0.1 ug/mL — ABNORMAL LOW (ref >1.3)
Pneumo Ab Type 4*: <0.1 ug/mL — ABNORMAL LOW (ref >1.3)
Pneumo Ab Type 43 (11A)*: <0.1 ug/mL — ABNORMAL LOW (ref >1.3)
Pneumo Ab Type 5*: <0.1 ug/mL — ABNORMAL LOW (ref >1.3)
Pneumo Ab Type 51 (7F)*: 1.7 ug/mL (ref >1.3)
Pneumo Ab Type 54 (15B)*: 1.1 ug/mL — ABNORMAL LOW (ref >1.3)
Pneumo Ab Type 56 (18C)*: 0.6 ug/mL — ABNORMAL LOW (ref >1.3)
Pneumo Ab Type 57 (19A)*: 0.7 ug/mL — ABNORMAL LOW (ref >1.3)
Pneumo Ab Type 68 (9V)*: <0.1 ug/mL — ABNORMAL LOW (ref >1.3)
Pneumo Ab Type 70 (33F)*: 7.9 ug/mL (ref >1.3)
Pneumo Ab Type 8*: <0.3 ug/mL — ABNORMAL LOW (ref >1.3)
Pneumo Ab Type 9 (9N)*: <0.1 ug/mL — ABNORMAL LOW (ref >1.3)

## 2023-07-07 LAB — DIPHTHERIA / TETANUS ANTIBODY PANEL
Diphtheria Ab: 0.7 [IU]/mL (ref <0.10)
Tetanus Ab, IgG: 1.44 [IU]/mL (ref <0.10)

## 2023-07-07 LAB — IGG, IGA, IGM
IgA/Immunoglobulin A, Serum: 112 mg/dL (ref 90–386)
IgG (Immunoglobin G), Serum: 873 mg/dL (ref 603–1613)
IgM (Immunoglobulin M), Srm: 39 mg/dL (ref 20–172)

## 2023-07-07 LAB — COMPLEMENT, TOTAL: Compl, Total (CH50): >60 U/mL (ref >41)

## 2023-08-10 ENCOUNTER — Ambulatory Visit (INDEPENDENT_AMBULATORY_CARE_PROVIDER_SITE_OTHER): Payer: Managed Care, Other (non HMO) | Admitting: Otolaryngology

## 2023-08-10 ENCOUNTER — Encounter (INDEPENDENT_AMBULATORY_CARE_PROVIDER_SITE_OTHER): Payer: Self-pay

## 2023-08-10 VITALS — Ht 69.0 in | Wt 316.0 lb

## 2023-08-10 DIAGNOSIS — J328 Other chronic sinusitis: Secondary | ICD-10-CM

## 2023-08-10 DIAGNOSIS — J342 Deviated nasal septum: Secondary | ICD-10-CM

## 2023-08-10 DIAGNOSIS — J343 Hypertrophy of nasal turbinates: Secondary | ICD-10-CM | POA: Diagnosis not present

## 2023-08-10 DIAGNOSIS — J3489 Other specified disorders of nose and nasal sinuses: Secondary | ICD-10-CM

## 2023-08-10 DIAGNOSIS — R0981 Nasal congestion: Secondary | ICD-10-CM | POA: Diagnosis not present

## 2023-08-10 DIAGNOSIS — J3089 Other allergic rhinitis: Secondary | ICD-10-CM

## 2023-08-10 MED ORDER — AMOXICILLIN-POT CLAVULANATE 875-125 MG PO TABS: 1.0000 | ORAL_TABLET | Freq: Two times a day (BID) | ORAL | 0 refills | Status: AC

## 2023-08-10 MED ORDER — PREDNISONE 10 MG PO TABS: 10.0000 mg | ORAL_TABLET | Freq: Every day | ORAL | 0 refills | Status: DC

## 2023-08-10 NOTE — Patient Instructions (Addendum)
Take Augmentin 875 mg by mouth (PO) twice daily for 12 days; take with food, take probiotic or yogurt with it Take Prednisone by mouth (PO) 30mg  x 4 days (3 pills in morning), then 20mg  x4 days (2 pills), then 10mg  x 4 days (1 pill), then stop. Risks discussed Continue ryaltris and zyrtec I have ordered an imaging study for you to complete prior to your next visit. Please call [facility] at [phone] to schedule your imaging if you have not received a call within 24 hours. If you are unable to complete your imaging study prior to your next scheduled visit please call our office to let us know.  Stop rinsing 3 days before the CT scan  Lloyd Huger Med Nasal Saline Rinse   - start nasal saline rinses with NeilMed Bottle available over the counter    Nasal Saline Irrigation instructions: If you choose to make your own salt water solution, You will need: Salt (kosher, canning, or pickling salt) Baking soda Nasal irrigation bottle (i.e. Lloyd Huger Med Sinus Rinse) Measuring spoon ( teaspoon) Distilled / boiled water   Mix solution Mix 1 teaspoon of salt, 1/2 teaspoon of baking soda and 1 cup of water into irrigation bottle May use saline packet instead of homemade recipe for this step if you prefer If medicine was prescribed to be mixed with solution, place this into bottle Examples 2 inches of 2% mupirocin ointment Budesonide solution Position your head: Lean over sink (about 45 degrees) Rotate head (about 45 degrees) so that one nostril is above the other Irrigate Insert tip of irrigation bottle into upper nostril so it forms a comfortable seal Irrigate while breathing through your mouth May remove the straw from the bottle in order to irrigate the entire solution (important if medicine was added) Exhale through nose when finished and blow nose as necessary  Repeat on opposite side with other 1/2 of solution (120 mL) or remake solution if all 240 mL was used on first  side Wash irrigation bottle regularly, replace every 3 months

## 2023-08-10 NOTE — Progress Notes (Signed)
Dear Dr. Selena Batten, Here is my assessment for our mutual patient, Tahj Ewoldt. Thank you for allowing me the opportunity to care for your patient. Please do not hesitate to contact me should you have any other questions. Sincerely, Dr. Jovita Kussmaul  Otolaryngology Clinic Note Referring provider: Dr. Selena Batten HPI:  Kyle Duncan is a 47 y.o. male kindly referred by Dr. Selena Batten for evaluation of chronic sinusitis and allergic rhinitis  Initial visit (07/2023): Patient reports: he reports that he has has had sinus issues for several years and allergies. He saw Dr. Selena Batten in 06/28/2023 for allergies. He reports that every couple of months, he is getting some kind of sinus infection and cold. He starts to have PND, maxillary pressure, nasal stuffiness/congestion and some pressure behind the eyes. It turns into a sinus infection -- then he will have headache, significant congestion, mostly mucoid drainage but intermittent discoloration, maxillary/frontal and ethmoid pressure (mostly max and ethmoid). Sense of smell is not great. He normally does get antibiotics for it (5-6 courses per year). Will also get steroids last 2-3 visits and both antibiotics and steroids help significantly. Allergy symptoms are "all the time" - baseline congested - both sides. Has had allergy testing (2024) - grass, ragweed, weed, trees, dust mites No frequent PNA or ear infections  Of note, he does think he has a deviated septum.  He is using Ryaltris, Zyrtec. He does think it helps some. He has not used saline rinses. Sudafed does not work, mucinex does not work. Has not used singulair. Advil cold/sinus works. He has not used afrin.  Does take supplements  No CT sinus.   Has GERD, pepcid.  Quit smoking 8 years ago  PMHx: Allergies, GERD, PSVT (history), OSA ON CPAP  H&N Surgery: no Personal or FHx of bleeding dz or anesthesia difficulty: no  GLP-1: no AP/AC: no  Tobacco: prior smoker (quit 8 years ago). Alcohol: no.  Occupation: Building control surveyor. Lives in [place], [place]  Independent Review of Additional Tests or Records:  Labs (07/01/2023):  Eos 0 IgG, A, M: negative; Complement: normal Strep pneumo titers: low Skin testing 03/29/2023 independently reviewed: + grass, ragweed, weed, trees, dust mites Dr. Elmyra Ricks office notes (Allergy) - 06/28/2023: constant sinus infection. Ryaltris, zyrtec; freq sinus infxn req abx/steroids. Did not try netipot or singulair; uses CPAP. Skin testing + to grass, ragweed, weed, trees, dust mites. Rec; Start singulair, ryaltris, allergy shots and ref to ENT; rec immune workup PCP note Dr. Yetta Barre 05/23/2023: No significant changes, continuing CPAP ED 06/24/2023: nasal drainage, watery eyes; Dx sinusitis; Rx augmentin and steroids and PO antihistamine ED 03/02/2023: Congestion, URI; Rx flonase  MRI Brain 09/11/22 rev independently: chronic b/l L > R sinusitis with sinuous septum; most prominent disease along bilateral max, but some ethmoid, sphenoid and frontal MPT as well   PMH/Meds/All/SocHx/FamHx/ROS:   Past Medical History:  Diagnosis Date   H/O: vasectomy 06/09/2016   01/2013   PSVT (paroxysmal supraventricular tachycardia) (HCC)    Recurrent upper respiratory infection (URI)      History reviewed. No pertinent surgical history.  Family History  Problem Relation Age of Onset   Hypertension Mother    Diabetes Mother    Cancer Father    Colon cancer Maternal Aunt    Stroke Paternal Uncle    Diabetes Maternal Grandmother    Hyperlipidemia Maternal Grandmother    Hypertension Maternal Grandmother    Breast cancer Maternal Grandmother    Hypertension Maternal Grandfather    Heart disease  Maternal Grandfather    Skin cancer Maternal Grandfather    Heart attack Paternal Grandmother    Stroke Paternal Grandfather    Allergic rhinitis Neg Hx    Asthma Neg Hx    Eczema Neg Hx    Urticaria Neg Hx      Social Connections: Unknown (02/23/2023)    Received from [facility]   Social Network    Social Network: Not on file      Current Outpatient Medications:    AMBULATORY NON FORMULARY MEDICATION, Continuous positive airway pressure (CPAP) machine set on AutoPAP (10-20 cmH2O), with all supplemental supplies as needed. Please provide a large size Fisher[employer]kel Full Face Simplus Mask., Disp: 1 each, Rfl: 0   amoxicillin-clavulanate (AUGMENTIN) 875-125 MG tablet, Take 1 tablet by mouth 2 (two) times daily for 12 days., Disp: 24 tablet, Rfl: 0   calcium-vitamin D (OSCAL WITH D) 500-5 MG-MCG tablet, Take 1 tablet by mouth., Disp: , Rfl:    cyanocobalamin (VITAMIN B12) 500 MCG tablet, Take 500 mcg by mouth daily., Disp: , Rfl:    MAGNESIUM HYDROXIDE PO, Take by mouth., Disp: , Rfl:    Menaquinone-7 (K2 PO), Take by mouth., Disp: , Rfl:    Olopatadine-Mometasone (RYALTRIS) 665-25 MCG/ACT SUSP, Place 1-2 sprays into the nose in the morning and at bedtime., Disp: 29 g, Rfl: 5   predniSONE (DELTASONE) 10 MG tablet, Take 1 tablet (10 mg total) by mouth daily with breakfast. Take Prednisone by mouth (PO) 30mg  x 4 days (3 pills in morning), then 20mg  x4 days (2 pills), then 10mg  x 4 days (1 pill), then stop, Disp: 24 tablet, Rfl: 0   tadalafil (CIALIS) 10 MG tablet, Take 1 tablet (10 mg total) by mouth daily., Disp: 20 tablet, Rfl: 5   Zinc Acetate, Oral, (ZINC ACETATE PO), Take by mouth., Disp: , Rfl:    fexofenadine (ALLEGRA ALLERGY) 180 MG tablet, Take 1 tablet (180 mg total) by mouth daily for 15 days., Disp: 15 tablet, Rfl: 0   Physical Exam:   Ht 5\' 9"  (1.753 m)   Wt (!) 316 lb (143.3 kg)   BMI 46.67 kg/m   Salient findings:  CN II-XII intact  Bilateral EAC clear and TM intact with well pneumatized middle ear spaces Anterior rhinoscopy: Septum dev right more dorsal, left more caudal ; bilateral inferior turbinates with L > R hypertrophy. Nasal endoscopy was indicated to better evaluate the nose and paranasal sinuses, given  the patient's history and exam findings, and is detailed below. No lesions of oral cavity/oropharynx; dentition good No obviously palpable neck masses/lymphadenopathy/thyromegaly No respiratory distress or stridor  Seprately Identifiable Procedures:  PROCEDURE: Bilateral Diagnostic Rigid Nasal Endoscopy Pre-procedure diagnosis: Concern for chronic sinusitis; nasal obstruction Post-procedure diagnosis: same Indication: See pre-procedure diagnosis and physical exam above Complications: None apparent EBL: 0 mL Anesthesia: Lidocaine 4% and topical decongestant was topically sprayed in each nasal cavity  Description of Procedure:  Patient was identified. A rigid 30 degree endoscope was utilized to evaluate the sinonasal cavities, mucosa, sinus ostia and turbinates and septum.  Overall, signs of mucosal inflammation are noted with mild mucosal edema. Noted septal deviation and turbinate hypertrophy b/l.  No mucopurulence, polyps, or masses noted.   Right Middle meatus: clear Right SE Recess: clear Left MM: clear Left SE Recess: clear Some thick/discolored drainage on bilateral inferior turbinates    Photodocumentation was obtained.  CPT CODE -- 95188 - Mod 25   Impression & Plans:  Connard Thorn is a  47 y.o. male with:  1. Other chronic sinusitis   2. Nasal obstruction   3. Nasal congestion   4. Hypertrophy of both inferior nasal turbinates   5. Non-seasonal allergic rhinitis due to other allergic trigger   6. Nasal septal deviation    We discussed the multifactorial (likely) etiology of his sx - likely allergies playing a role in mucosal edema, and then predisposition to infections as a result. He also has nasal obstruction with corresponding septal deviation. We discussed his options and reviewed endo, which shows some thick secretions over turbinates but otherwise no purulence. He decided to proceed with maximal medical management and post-treatment sinus CT given MRI  findings  Start Augmentin BID and Prednisone taper as below Continue ryaltris and zyrtec Will do post-treatment CT Appreciate Dr. Elmyra Ricks help in managing the allergic component  - f/u 6 weeks  See below regarding exact medications prescribed this encounter including dosages and route: Meds ordered this encounter  Medications   amoxicillin-clavulanate (AUGMENTIN) 875-125 MG tablet    Sig: Take 1 tablet by mouth 2 (two) times daily for 12 days.    Dispense:  24 tablet    Refill:  0   predniSONE (DELTASONE) 10 MG tablet    Sig: Take 1 tablet (10 mg total) by mouth daily with breakfast. Take Prednisone by mouth (PO) 30mg  x 4 days (3 pills in morning), then 20mg  x4 days (2 pills), then 10mg  x 4 days (1 pill), then stop    Dispense:  24 tablet    Refill:  0      Thank you for allowing me the opportunity to care for your patient. Please do not hesitate to contact me should you have any other questions.  Sincerely, Jovita Kussmaul, MD Otolarynoglogist (ENT), [facility] Phone: [phone] Fax: [phone]  08/10/2023, 4:39 PM   MDM:  Level 4 Complexity/Problems addressed: chronic problems - one with exacerbation Data complexity: mod - independent review of imaging/test. Multiple note and lab review - Morbidity: mod  - Prescription Drug prescribed or managed: yes

## 2023-08-15 ENCOUNTER — Telehealth: Payer: Self-pay

## 2023-08-15 NOTE — Telephone Encounter (Signed)
[facility] in [place] called and left voicemail on Plastic Surgery voicemail stating they received a prescription for prednisone on 12/18 with two different sigs. One is for patient to take once daily and the other is a taper schedule. They need a new script with correct instructions sent in or someone to call to clarify instructions.   Please send response to a member of the ENT team. Plastics is closed for the day.   Thanks!

## 2023-08-23 ENCOUNTER — Ambulatory Visit: Payer: Managed Care, Other (non HMO)

## 2023-08-23 DIAGNOSIS — J343 Hypertrophy of nasal turbinates: Secondary | ICD-10-CM

## 2023-08-23 DIAGNOSIS — J3489 Other specified disorders of nose and nasal sinuses: Secondary | ICD-10-CM

## 2023-08-23 DIAGNOSIS — R0981 Nasal congestion: Secondary | ICD-10-CM | POA: Diagnosis not present

## 2023-08-23 DIAGNOSIS — J328 Other chronic sinusitis: Secondary | ICD-10-CM | POA: Diagnosis not present

## 2023-09-08 ENCOUNTER — Other Ambulatory Visit: Payer: Self-pay

## 2023-09-08 MED ORDER — RYALTRIS 665-25 MCG/ACT NA SUSP: 1.0000 | Freq: Two times a day (BID) | NASAL | 5 refills | Status: DC

## 2023-09-20 ENCOUNTER — Telehealth (INDEPENDENT_AMBULATORY_CARE_PROVIDER_SITE_OTHER): Payer: Self-pay | Admitting: Otolaryngology

## 2023-09-20 NOTE — Telephone Encounter (Signed)
Spoke with patient and confirmed appointment and address for 09/21/2023.

## 2023-09-21 ENCOUNTER — Ambulatory Visit (INDEPENDENT_AMBULATORY_CARE_PROVIDER_SITE_OTHER): Payer: Managed Care, Other (non HMO) | Admitting: Otolaryngology

## 2023-09-21 ENCOUNTER — Encounter (INDEPENDENT_AMBULATORY_CARE_PROVIDER_SITE_OTHER): Payer: Self-pay

## 2023-09-21 VITALS — BP 139/80 | HR 57 | Resp 19 | Wt 316.0 lb

## 2023-09-21 DIAGNOSIS — J3489 Other specified disorders of nose and nasal sinuses: Secondary | ICD-10-CM | POA: Diagnosis not present

## 2023-09-21 DIAGNOSIS — R0981 Nasal congestion: Secondary | ICD-10-CM

## 2023-09-21 DIAGNOSIS — J328 Other chronic sinusitis: Secondary | ICD-10-CM | POA: Diagnosis not present

## 2023-09-21 DIAGNOSIS — J343 Hypertrophy of nasal turbinates: Secondary | ICD-10-CM | POA: Diagnosis not present

## 2023-09-21 DIAGNOSIS — J342 Deviated nasal septum: Secondary | ICD-10-CM

## 2023-09-21 MED ORDER — AMOXICILLIN-POT CLAVULANATE 875-125 MG PO TABS: 1.0000 | ORAL_TABLET | Freq: Two times a day (BID) | ORAL | 0 refills | Status: DC

## 2023-09-21 MED ORDER — PREDNISONE 20 MG PO TABS: 20.0000 mg | ORAL_TABLET | Freq: Every day | ORAL | 0 refills | Status: DC

## 2023-09-21 NOTE — Patient Instructions (Addendum)
Take Augmentin 875 mg by mouth (PO) twice daily for 10 days --- start 5 days before surgery; take with food, take probiotic or yogurt with it Take prednisone 20mg  daily for 10 days -- start 5 days before surgery, finish after Continue ryaltris spray  Lloyd Huger Med Nasal Saline Rinse   - start nasal saline rinses with NeilMed Bottle available over the counter    Nasal Saline Irrigation instructions: If you choose to make your own salt water solution, You will need: Salt (kosher, canning, or pickling salt) Baking soda Nasal irrigation bottle (i.e. Lloyd Huger Med Sinus Rinse) Measuring spoon ( teaspoon) Distilled / boiled water   Mix solution Mix 1 teaspoon of salt, 1/2 teaspoon of baking soda and 1 cup of water into irrigation bottle ** May use saline packet instead of homemade recipe for this step if you prefer If medicine was prescribed to be mixed with solution, place this into bottle Examples 2 inches of 2% mupirocin ointment Budesonide solution Position your head: Lean over sink (about 45 degrees) Rotate head (about 45 degrees) so that one nostril is above the other Irrigate Insert tip of irrigation bottle into upper nostril so it forms a comfortable seal Irrigate while breathing through your mouth May remove the straw from the bottle in order to irrigate the entire solution (important if medicine was added) Exhale through nose when finished and blow nose as necessary  Repeat on opposite side with other 1/2 of solution (120 mL) or remake solution if all 240 mL was used on first side Wash irrigation bottle regularly, replace every 3 months

## 2023-09-21 NOTE — Progress Notes (Signed)
Dear Dr. Larinda Buttery, Here is my assessment for our mutual patient, Kyle Duncan. Thank you for allowing me the opportunity to care for your patient. Please do not hesitate to contact me should you have any other questions. Sincerely, Dr. Jovita Kussmaul  Otolaryngology Clinic Note Referring provider: Dr. Larinda Buttery HPI:  Kyle Duncan is a 49 y.o. male kindly referred by Dr. Larinda Buttery for evaluation of chronic sinusitis and allergic rhinitis  Initial visit (07/2023): Patient reports: he reports that he has has had sinus issues for several years and allergies. He saw Dr. Selena Batten in 06/28/2023 for allergies. He reports that every couple of months, he is getting some kind of sinus infection and cold. He starts to have PND, maxillary pressure, nasal stuffiness/congestion and some pressure behind the eyes. It turns into a sinus infection -- then he will have headache, significant congestion, mostly mucoid drainage but intermittent discoloration, maxillary/frontal and ethmoid pressure (mostly max and ethmoid). Sense of smell is not great. He normally does get antibiotics for it (5-6 courses per year). Will also get steroids last 2-3 visits and both antibiotics and steroids help significantly. Allergy symptoms are "all the time" - baseline congested - both sides. Has had allergy testing (2024) - grass, ragweed, weed, trees, dust mites No frequent PNA or ear infections  Of note, he does think he has a deviated septum.  He is using Ryaltris, Zyrtec. He does think it helps some. He has not used saline rinses. Sudafed does not work, mucinex does not work. Has not used singulair. Advil cold/sinus works. He has not used afrin.  Does take supplements  No CT sinus.   Has GERD, pepcid. --------------------------------------------------------- He was treated medically maximally and now returns after post-treatment CT.  09/21/2023 He reports that he is doing ok from sinus standpoint; no discolored drainage, feels like  things have cleared some. No significant pressure. 5-6 infections/year overall still. He is using ryaltris. Antibiotics and steroids helped. Complains of left nasal obstruction. We did discuss his CT. ---------------------------------------------------------  Quit smoking 8 years ago  PMHx: Allergies, GERD, PSVT (history), OSA ON CPAP  H&N Surgery: no Personal or FHx of bleeding dz or anesthesia difficulty: no  GLP-1: no AP/AC: no  Tobacco: prior smoker (quit 8 years ago). Alcohol: no. Occupation: Building control surveyor. Lives in [place], [place]  Independent Review of Additional Tests or Records:  Labs (07/01/2023):  Eos 0 IgG, A, M: negative; Complement: normal Strep pneumo titers: low Skin testing 03/29/2023 independently reviewed: + grass, ragweed, weed, trees, dust mites Dr. Elmyra Ricks office notes (Allergy) - 06/28/2023: constant sinus infection. Ryaltris, zyrtec; freq sinus infxn req abx/steroids. Did not try netipot or singulair; uses CPAP. Skin testing + to grass, ragweed, weed, trees, dust mites. Rec; Start singulair, ryaltris, allergy shots and ref to ENT; rec immune workup PCP note Dr. Yetta Barre 05/23/2023: No significant changes, continuing CPAP ED 06/24/2023: nasal drainage, watery eyes; Dx sinusitis; Rx augmentin and steroids and PO antihistamine ED 03/02/2023: Congestion, URI; Rx flonase  MRI Brain 09/11/22 rev independently: chronic b/l L > R sinusitis with sinuous septum; most prominent disease along bilateral max, but some ethmoid, sphenoid and frontal MPT as well   CT Sinus 08/23/2023 independently reviewed and interpreted: left caudal septal deviation, right posterior; bilateral inferior turbinate hypertrophy; trace thickening left frontal and mild b/l ethmoids; bilateral maxillary sinus opacification along floors with cyss, narrow OMC; maxillary disease has not resolved as compared to MRI   PMH/Meds/All/SocHx/FamHx/ROS:   Past Medical History:  Diagnosis Date  H/O: vasectomy 06/09/2016   01/2013   PSVT (paroxysmal supraventricular tachycardia) (HCC)    Recurrent upper respiratory infection (URI)      History reviewed. No pertinent surgical history.  Family History  Problem Relation Age of Onset   Hypertension Mother    Diabetes Mother    Cancer Father    Colon cancer Maternal Aunt    Stroke Paternal Uncle    Diabetes Maternal Grandmother    Hyperlipidemia Maternal Grandmother    Hypertension Maternal Grandmother    Breast cancer Maternal Grandmother    Hypertension Maternal Grandfather    Heart disease Maternal Grandfather    Skin cancer Maternal Grandfather    Heart attack Paternal Grandmother    Stroke Paternal Grandfather    Allergic rhinitis Neg Hx    Asthma Neg Hx    Eczema Neg Hx    Urticaria Neg Hx      Social Connections: Unknown (02/23/2023)   Received from [employer], [facility]   Social Network    Social Network: Not on file      Current Outpatient Medications:    AMBULATORY NON FORMULARY MEDICATION, Continuous positive airway pressure (CPAP) machine set on AutoPAP (10-20 cmH2O), with all supplemental supplies as needed. Please provide a large size Fisher[employer]kel Full Face Simplus Mask., Disp: 1 each, Rfl: 0   amoxicillin-clavulanate (AUGMENTIN) 875-125 MG tablet, Take 1 tablet by mouth 2 (two) times daily for 10 days., Disp: 20 tablet, Rfl: 0   anastrozole (ARIMIDEX) 1 MG tablet, Take 1 mg by mouth daily., Disp: , Rfl:    calcium-vitamin D (OSCAL WITH D) 500-5 MG-MCG tablet, Take 1 tablet by mouth., Disp: , Rfl:    cyanocobalamin (VITAMIN B12) 500 MCG tablet, Take 500 mcg by mouth daily., Disp: , Rfl:    MAGNESIUM HYDROXIDE PO, Take by mouth., Disp: , Rfl:    Menaquinone-7 (K2 PO), Take by mouth., Disp: , Rfl:    Olopatadine-Mometasone (RYALTRIS) 665-25 MCG/ACT SUSP, Place 1-2 sprays into the nose in the morning and at bedtime., Disp: 32 g, Rfl: 5   predniSONE (DELTASONE) 20 MG tablet, Take 1 tablet (20 mg  total) by mouth daily with breakfast for 10 days., Disp: 10 tablet, Rfl: 0   tadalafil (CIALIS) 10 MG tablet, Take 1 tablet (10 mg total) by mouth daily., Disp: 20 tablet, Rfl: 5   Zinc Acetate, Oral, (ZINC ACETATE PO), Take by mouth., Disp: , Rfl:    fexofenadine (ALLEGRA ALLERGY) 180 MG tablet, Take 1 tablet (180 mg total) by mouth daily for 15 days., Disp: 15 tablet, Rfl: 0   Physical Exam:   BP 139/80 (BP Location: Left Arm, Patient Position: Sitting, Cuff Size: Normal)   Pulse (!) 57   Resp 19   Wt (!) 316 lb (143.3 kg)   SpO2 97%   BMI 46.67 kg/m   Salient findings:  CN II-XII intact  Bilateral EAC clear and TM intact with well pneumatized middle ear spaces Anterior rhinoscopy: Septum dev right more dorsal, left more caudal ; bilateral inferior turbinates with L > R hypertrophy No lesions of oral cavity/oropharynx; dentition good No obviously palpable neck masses/lymphadenopathy/thyromegaly No respiratory distress or stridor  Seprately Identifiable Procedures:  PROCEDURE (Prior not today) Description of Procedure:  Patient was identified. A rigid 30 degree endoscope was utilized to evaluate the sinonasal cavities, mucosa, sinus ostia and turbinates and septum.  Overall, signs of mucosal inflammation are noted with mild mucosal edema. Noted septal deviation and turbinate hypertrophy b/l.  No mucopurulence, polyps,  or masses noted.   Right Middle meatus: clear Right SE Recess: clear Left MM: clear Left SE Recess: clear Some thick/discolored drainage on bilateral inferior turbinates    Photodocumentation was obtained.  CPT CODE -- 16109 - Mod 25   Impression & Plans:  Kyle Duncan is a 48 y.o. male with:  1. Nasal obstruction   2. Other chronic sinusitis   3. Nasal congestion   4. Hypertrophy of both inferior nasal turbinates   5. Nasal septal deviation    We discussed the multifactorial (likely) etiology of his sx - likely allergies playing a role in mucosal  edema, and then predisposition to infections as a result. He also has nasal obstruction with corresponding septal deviation which is a structural component.  He also continues to have frequent exacerbations of his sinus infections requiring ~5 courses of antibiotics per year with persistent disease on post-treatment CT after maximal medication management. This has not changed compared to MRI in Jan 2024.   He continues to have nasal obstruction which has not responded to medical therapy   We discussed the goals of septoplasty and turbinate reduction, and expectations for postoperative management. Will plan to leave splints in place, and removal was also discussed. We also discussed nasal obstruction post-operatively until splints in place and pain management. We discussed R/B/A including pain, infection, bleeding (~5% risk of operative visit for control), persistent symptoms, need for revision surgery, and other risks including damage to surrounding structures, septal perforation, and injury to skull base with risk of CSF leak and additional intracranial complications, anesthetic complications, among others.  We discussed the goals of sinus surgery, and expectations for postoperative management. We discussed R/B/A including pain, infection, bleeding (~5% risk of operative visit for control), persistent symptoms, need for revision surgery, and other risks including damage to the eye and loss of vision, and injury to skull base with risk of CSF leak and additional intracranial complications, anesthetic complications, among others.  Patient understands and is ready to proceed.  We discussed use of nasal saline spray and nasal saline irrigations post-operatively We also discussed use of rylatris given allergic component Patient understands and is ready to proceed.   Peri-op augmentin and steroids as below Continue ryaltris and zyrtec Will schedule for septoplasty, b/l inferior turbinate reduction,  bilateral maxillary antrostomy with tissue removal to address his max disease (see above, persistent) and b/l anterior ethmoidectomy with image guidance Appreciate Dr. Elmyra Ricks help in managing the allergic component  - f/u POD 5  See below regarding exact medications prescribed this encounter including dosages and route: Meds ordered this encounter  Medications   amoxicillin-clavulanate (AUGMENTIN) 875-125 MG tablet    Sig: Take 1 tablet by mouth 2 (two) times daily for 10 days.    Dispense:  20 tablet    Refill:  0   predniSONE (DELTASONE) 20 MG tablet    Sig: Take 1 tablet (20 mg total) by mouth daily with breakfast for 10 days.    Dispense:  10 tablet    Refill:  0      Thank you for allowing me the opportunity to care for your patient. Please do not hesitate to contact me should you have any other questions.  Sincerely, Jovita Kussmaul, MD Otolaryngologist (ENT), [facility] Phone: [phone] Fax: [phone]  09/21/2023, 9:41 AM   MDM:  Level 4 - 99214 Complexity/Problems addressed: mod - multiple chronic problems Data complexity: mod - independent review and interpretation of CT imaging - Morbidity: mod  -  Prescription Drug prescribed or managed: yes

## 2023-09-29 ENCOUNTER — Encounter: Payer: Self-pay | Admitting: Allergy

## 2023-09-29 ENCOUNTER — Ambulatory Visit: Payer: Managed Care, Other (non HMO) | Admitting: Allergy

## 2023-09-29 VITALS — BP 130/70 | HR 67 | Temp 97.7°F | Resp 16 | Wt 317.5 lb

## 2023-09-29 DIAGNOSIS — K219 Gastro-esophageal reflux disease without esophagitis: Secondary | ICD-10-CM

## 2023-09-29 DIAGNOSIS — J3089 Other allergic rhinitis: Secondary | ICD-10-CM

## 2023-09-29 DIAGNOSIS — J301 Allergic rhinitis due to pollen: Secondary | ICD-10-CM | POA: Diagnosis not present

## 2023-09-29 DIAGNOSIS — B999 Unspecified infectious disease: Secondary | ICD-10-CM | POA: Diagnosis not present

## 2023-09-29 MED ORDER — PNEUMOCOCCAL VAC POLYVALENT 25 MCG/0.5ML IJ SOSY: 0.5000 mL | PREFILLED_SYRINGE | Freq: Once | INTRAMUSCULAR | 0 refills | Status: AC

## 2023-09-29 NOTE — Progress Notes (Signed)
 Follow Up Note  RE: Kyle Duncan MRN: [id number] DOB: 14-Aug-1976 Date of Office Visit: 09/29/2023  Referring provider: Willo Mini, NP Primary care provider: Willo Mini, NP  Chief Complaint: Follow-up  History of Present Illness: I had the pleasure of seeing Kyle Duncan for a follow up visit at the [facility] on 09/29/2023. He is a 48 y.o. male, who is being followed for allergic rhinitis, recurrent infections, GERD. His previous allergy  office visit was on 06/28/2023 with Dr. Luke. Today is a regular follow up visit.  Discussed the use of AI scribe software for clinical note transcription with the patient, who gave verbal consent to proceed.  He is scheduled for sinus surgery on March 17th due to persistent sinus blockage despite treatment with antibiotics and steroids. He has a deviated septum, described as an 'S turn', with nasal hypertrophy. He has been experiencing chronic sinus issues but notes that he has not had a cold or sinus reaction for the longest period recently.  He is currently using nasal sprays as prescribed, one spray in the morning and one at night, which he finds helpful. He has not been consistently performing nasal lavage or rinses but plans to increase this practice as surgery approaches. He has not been taking any allergy  medications recently as he feels his symptoms are under control.  He has not yet received the pneumonia vaccine despite a recommendation due to low pneumococcal titers. He expresses hesitancy about vaccines due to his extensive vaccination history during his time in the [employer].  He takes over-the-counter Pepcid (famotidine) once daily for reflux or heartburn, which he finds helpful. He has not had any recent infections aside from those treated by ENT with Augmentin .     I reviewed your labwork. Your blood count, immunoglobulin levels were normal which is great. You also have good protection against diptheria and  tetanus. Pneumococcal titers were low.   09/21/2023 ENT visit:  1. Nasal obstruction   2. Other chronic sinusitis   3. Nasal congestion   4. Hypertrophy of both inferior nasal turbinates   5. Nasal septal deviation     We discussed the multifactorial (likely) etiology of his sx - likely allergies playing a role in mucosal edema, and then predisposition to infections as a result. He also has nasal obstruction with corresponding septal deviation which is a structural component.   He also continues to have frequent exacerbations of his sinus infections requiring ~5 courses of antibiotics per year with persistent disease on post-treatment CT after maximal medication management. This has not changed compared to MRI in Jan 2024.    He continues to have nasal obstruction which has not responded to medical therapy    We discussed the goals of septoplasty and turbinate reduction, and expectations for postoperative management. Will plan to leave splints in place, and removal was also discussed. We also discussed nasal obstruction post-operatively until splints in place and pain management. We discussed R/B/A including pain, infection, bleeding (~5% risk of operative visit for control), persistent symptoms, need for revision surgery, and other risks including damage to surrounding structures, septal perforation, and injury to skull base with risk of CSF leak and additional intracranial complications, anesthetic complications, among others.   We discussed the goals of sinus surgery, and expectations for postoperative management. We discussed R/B/A including pain, infection, bleeding (~5% risk of operative visit for control), persistent symptoms, need for revision surgery, and other risks including damage to the eye and loss of vision,  and injury to skull base with risk of CSF leak and additional intracranial complications, anesthetic complications, among others.  Patient understands and is ready to  proceed.   We discussed use of nasal saline spray and nasal saline irrigations post-operatively We also discussed use of Ryaltris  given allergic component Patient understands and is ready to proceed.    Peri-op Augmentin  and steroids as below Continue Ryaltris  and zyrtec Will schedule for septoplasty, b/l inferior turbinate reduction, bilateral maxillary antrostomy with tissue removal to address his max disease (see above, persistent) and b/l anterior ethmoidectomy with image guidance Appreciate Dr. Mariella help in managing the allergic component  Assessment and Plan: Kyle Duncan is a 47 y.o. male with: Allergic rhinitis due to dust mite Seasonal allergic rhinitis due to pollen Past history - perennial rhinitis symptoms which flared the past year.  Tried Zyrtec and Flonase  with some benefit.  No prior ENT evaluation.  2 dogs and 1 rabbit at home. 2024 skin testing positive to grass, ragweed, weed, trees, dust mites.  Interim history - saw ENT and planning on sinus surgery. Keep follow up and surgery as scheduled by ENT.  Continue environmental control measures. Use over the counter antihistamines such as Zyrtec (cetirizine), Claritin (loratadine), Allegra  (fexofenadine ), or Xyzal  (levocetirizine) daily as needed. May take twice a day during allergy  flares. May switch antihistamines every few months. May use Singulair  (montelukast ) 10mg  daily at night. Continue Ryaltris  (olopatadine + mometasone nasal spray combination) 1-2 sprays per nostril twice a day.  Nasal saline spray (i.e., Simply Saline) or nasal saline lavage (i.e., NeilMed) is recommended as needed and prior to medicated nasal sprays. Consider allergy  injections for long term control if above medications do not help the symptoms.   Recurrent infections Past history - frequent sinus infections requiring antibiotics and steroids. Discussed the potential role of underlying allergies and possible structural issues contributing to  recurrent infections. Interim history - 2024 labs normal immunoglobulin levels, poor pneumococcal titers.  Keep track of infections and antibiotics use. Get pneumovax 23 and repeat titers 4-6 weeks after vaccine.   Gastroesophageal reflux disease, unspecified whether esophagitis present Continue lifestyle and dietary modifications. May take famotidine 20-40mg  daily as needed.  Return in about 4 months (around 01/27/2024).  Meds ordered this encounter  Medications   pneumococcal 23 valent vaccine (PNEUMOVAX-23) 25 MCG/0.5ML injection    Sig: Inject 0.5 mLs into the muscle once for 1 dose.    Dispense:  0.5 mL    Refill:  0   Lab Orders  No laboratory test(s) ordered today    Diagnostics: None.   Medication List:  Current Outpatient Medications  Medication Sig Dispense Refill   AMBULATORY NON FORMULARY MEDICATION Continuous positive airway pressure (CPAP) machine set on AutoPAP (10-20 cmH2O), with all supplemental supplies as needed. Please provide a large size Fisher[employer]Paykel Full Face Simplus Mask. 1 each 0   calcium-vitamin D  (OSCAL WITH D) 500-5 MG-MCG tablet Take 1 tablet by mouth.     cyanocobalamin (VITAMIN B12) 500 MCG tablet Take 500 mcg by mouth daily.     MAGNESIUM HYDROXIDE PO Take by mouth.     Menaquinone-7 (K2 PO) Take by mouth.     Olopatadine-Mometasone (RYALTRIS ) 665-25 MCG/ACT SUSP Place 1-2 sprays into the nose in the morning and at bedtime. 32 g 5   pneumococcal 23 valent vaccine (PNEUMOVAX-23) 25 MCG/0.5ML injection Inject 0.5 mLs into the muscle once for 1 dose. 0.5 mL 0   Zinc Acetate, Oral, (ZINC ACETATE PO) Take by mouth.  fexofenadine  (ALLEGRA  ALLERGY ) 180 MG tablet Take 1 tablet (180 mg total) by mouth daily for 15 days. 15 tablet 0   tadalafil  (CIALIS ) 10 MG tablet Take 1 tablet (10 mg total) by mouth daily. (Patient not taking: Reported on 09/29/2023) 20 tablet 5   No current facility-administered medications for this visit.   Allergies: Allergies   Allergen Reactions   Bee Pollen     Any pollen   Dust Mite Mixed Allergen Ext [Mite (D. Farinae)]     Dust   I reviewed his past medical history, social history, family history, and environmental history and no significant changes have been reported from his previous visit.  Review of Systems  Constitutional:  Negative for appetite change, chills, fever and unexpected weight change.  HENT:  Positive for congestion.   Eyes:  Negative for itching.  Respiratory:  Negative for cough, chest tightness, shortness of breath and wheezing.   Cardiovascular:  Negative for chest pain.  Gastrointestinal:  Negative for abdominal pain.  Genitourinary:  Negative for difficulty urinating.  Skin:  Negative for rash.  Allergic/Immunologic: Positive for environmental allergies.  Neurological:  Negative for headaches.    Objective: BP 130/70   Pulse 67   Temp 97.7 F (36.5 C)   Resp 16   Wt (!) 317 lb 8 oz (144 kg)   SpO2 97%   BMI 46.89 kg/m  Body mass index is 46.89 kg/m. Physical Exam Vitals and nursing note reviewed.  Constitutional:      Appearance: Normal appearance. He is well-developed.  HENT:     Head: Normocephalic and atraumatic.     Right Ear: Tympanic membrane and external ear normal.     Left Ear: Tympanic membrane and external ear normal.     Nose: Congestion present.     Mouth/Throat:     Mouth: Mucous membranes are moist.     Pharynx: Oropharynx is clear.  Eyes:     Conjunctiva/sclera: Conjunctivae normal.  Cardiovascular:     Rate and Rhythm: Normal rate and regular rhythm.     Heart sounds: Normal heart sounds. No murmur heard.    No friction rub. No gallop.  Pulmonary:     Effort: Pulmonary effort is normal.     Breath sounds: Normal breath sounds. No wheezing, rhonchi or rales.  Musculoskeletal:     Cervical back: Neck supple.  Skin:    General: Skin is warm.     Findings: No rash.  Neurological:     Mental Status: He is alert and oriented to person,  place, and time.  Psychiatric:        Behavior: Behavior normal.    Previous notes and tests were reviewed. The plan was reviewed with the patient/family, and all questions/concerned were addressed.  It was my pleasure to see Kyle Duncan today and participate in his care. Please feel free to contact me with any questions or concerns.  Sincerely,  Orlan Cramp, DO Allergy  & Immunology  [facility]  [place] office: [phone] [facility] office: [phone]

## 2023-09-29 NOTE — Patient Instructions (Addendum)
 Sinuses Keep follow up and surgery as scheduled by ENT.   Environmental allergies 2024 skin testing positive to grass, ragweed, weed, trees, dust mites.  Continue environmental control measures as below. Use over the counter antihistamines such as Zyrtec (cetirizine), Claritin (loratadine), Allegra  (fexofenadine ), or Xyzal  (levocetirizine) daily as needed. May take twice a day during allergy  flares. May switch antihistamines every few months. May use Singulair  (montelukast ) 10mg  daily at night. Continue Ryaltris  (olopatadine + mometasone nasal spray combination) 1-2 sprays per nostril twice a day.  Nasal saline spray (i.e., Simply Saline) or nasal saline lavage (i.e., NeilMed) is recommended as needed and prior to medicated nasal sprays. Consider allergy  injections for long term control if above medications do not help the symptoms.  Heartburn: Continue lifestyle and dietary modifications. May take famotidine 20-40mg  daily as needed.   Infections Keep track of infections and antibiotics use. I reviewed your labwork. Your blood count, immunoglobulin levels were normal which is great. You also have good protection against diptheria and tetanus. However, your pneumococcal titers were low. Sometimes people with low titers are more likely to develop respiratory infections caused by the bacteria strep pneumoniae. I would like for you to get the pneumovax 23 vaccine (also known as the pneumonia shot) as it can boost the levels and offer protection against this bacteria in the future. Once you get the vaccine, we check the levels 4 weeks afterwards to make sure your immune system responded to the vaccine appropriately. You can get the pneumovax vaccine at your PCP's office or pharmacy. If they don't offer it there, let us  know and in certain cases we have given them in our office. Make sure it's pneumovax 23 vaccine.   Follow up in 4 months or sooner if needed.    Reducing Pollen Exposure Pollen  seasons: trees (spring), grass (summer) and ragweed/weeds (fall). Keep windows closed in your home and car to lower pollen exposure.  Install air conditioning in the bedroom and throughout the house if possible.  Avoid going out in dry windy days - especially early morning. Pollen counts are highest between 5 - 10 AM and on dry, hot and windy days.  Save outside activities for late afternoon or after a heavy rain, when pollen levels are lower.  Avoid mowing of grass if you have grass pollen allergy . Be aware that pollen can also be transported indoors on people and pets.  Dry your clothes in an automatic dryer rather than hanging them outside where they might collect pollen.  Rinse hair and eyes before bedtime.  Control of House Dust Mite Allergen Dust mite allergens are a common trigger of allergy  and asthma symptoms. While they can be found throughout the house, these microscopic creatures thrive in warm, humid environments such as bedding, upholstered furniture and carpeting. Because so much time is spent in the bedroom, it is essential to reduce mite levels there.  Encase pillows, mattresses, and box springs in special allergen-proof fabric covers or airtight, zippered plastic covers.  Bedding should be washed weekly in hot water (130 F) and dried in a hot dryer. Allergen-proof covers are available for comforters and pillows that can't be regularly washed.  Wash the allergy -proof covers every few months. Minimize clutter in the bedroom. Keep pets out of the bedroom.  Keep humidity less than 50% by using a dehumidifier or air conditioning. You can buy a humidity measuring device called a hygrometer to monitor this.  If possible, replace carpets with hardwood, linoleum, or washable area rugs. If  that's not possible, vacuum frequently with a vacuum that has a HEPA filter. Remove all upholstered furniture and non-washable window drapes from the bedroom. Remove all non-washable stuffed toys from  the bedroom.  Wash stuffed toys weekly.  Buffered Isotonic Saline Irrigations:  Goal: When you irrigate with the isotonic saline (salt water) it washes mucous and other debris from your nose that could be contributing to your nasal symptoms.   Recipe: Obtain 1 quart jar that is clean Fill with clean (bottled, boiled or distilled) water Add 1-2 heaping teaspoons of salt without iodine If the solution with 2 teaspoons of salt is too strong, adjust the amount down until better tolerated Add 1 teaspoon of Arm [employer] Hammer baking soda (pure bicarbonate) Mix ingredients together and store at room temperature and discard after 1 week * Alternatively you can buy pre made salt packets for the NeilMed bottle or there          are other over the counter brands available  Instructions: Warm  cup of the solution in the microwave if desired but be careful not to overheat as this will burn the inside of your nose Stand over a sink (or do it while you shower) and squirt the solution into one side of your nose aiming towards the back of your head Sometimes saying "coca cola" while irrigating can be helpful to prevent fluid from going down your throat  The solution will travel to the back of your nose and then come out the other side Perform this again on the other side Try to do this twice a day If you are using a nasal spray in addition to the irrigation, irrigate first and then use the topical nasal spray otherwise you will wash the nasal spray out of your nose

## 2023-10-03 ENCOUNTER — Telehealth: Payer: Self-pay

## 2023-10-03 MED ORDER — OSELTAMIVIR PHOSPHATE 75 MG PO CAPS: 75.0000 mg | ORAL_CAPSULE | Freq: Every day | ORAL | 0 refills | Status: DC

## 2023-10-03 NOTE — Telephone Encounter (Signed)
 Patient called in with reports of his daughter testing positive for flu. He is currently asymptomatic but has been in close proximity to her. Sending in Tamiflu  75mg  once daily for 10 days.   ___________________________________________ Maryl Snook, DNP, APRN, FNP-BC Primary Care and Sports Medicine [facility] [place]

## 2023-10-03 NOTE — Telephone Encounter (Signed)
 Copied from CRM [id number]. Topic: Clinical - Medical Advice >> Oct 03, 2023 10:42 AM Star East wrote: Reason for CRM: daughter has flu and patient is asking if he needs tamiflu  as a precaution, please call patient [phone]

## 2023-10-14 ENCOUNTER — Encounter (INDEPENDENT_AMBULATORY_CARE_PROVIDER_SITE_OTHER): Payer: Managed Care, Other (non HMO)

## 2023-10-31 ENCOUNTER — Other Ambulatory Visit: Payer: Self-pay

## 2023-10-31 ENCOUNTER — Telehealth: Payer: Self-pay

## 2023-10-31 ENCOUNTER — Encounter (HOSPITAL_BASED_OUTPATIENT_CLINIC_OR_DEPARTMENT_OTHER): Payer: Self-pay | Admitting: *Deleted

## 2023-10-31 NOTE — Telephone Encounter (Signed)
 Patient was prescribed ABX and steroid on 09/21/23.  Does he need to start that now, or wait until his surgery. Sent message to Dr. Allena Katz.

## 2023-10-31 NOTE — Progress Notes (Signed)
   10/31/23 1018  PAT Phone Screen  Is the patient taking a GLP-1 receptor agonist? No  Do You Have Diabetes? No  Do You Have Hypertension? No  Have You Ever Been to the ER for Asthma? No  Have You Taken Oral Steroids in the Past 3 Months? Yes (prednisone per Dr Eliane Decree direction)  Do you Take Phenteramine or any Other Diet Drugs? No  Recent  Lab Work, EKG, CXR? No  Do you have a history of heart problems? No  Have you ever had tests on your heart? (S)  Yes  What cardiac tests were performed? (S)  Cardiac Cath  What date/year were cardiac tests completed? (S)  2012 [facility]. Hx of PSVT - denies any issues w/ HR >120 in several years. EKG 08/2022 NSR.  Any Recent Hospitalizations? No  Height 5\' 9"  (1.753 m)  Weight (!) 138.3 kg  Pat Appointment Scheduled No  Reason for No Appointment Not Needed   Reviewed pt pmh with Dr Tacy Dura- ok to proceed with surgery at [facility] as planned, No PAT appt needed.

## 2023-11-02 NOTE — Telephone Encounter (Signed)
 Called to advise patient should start the ABX and steroid. Patient understood and agreed with plan.

## 2023-11-07 ENCOUNTER — Ambulatory Visit (HOSPITAL_BASED_OUTPATIENT_CLINIC_OR_DEPARTMENT_OTHER): Payer: Self-pay | Admitting: Anesthesiology

## 2023-11-07 ENCOUNTER — Encounter (HOSPITAL_BASED_OUTPATIENT_CLINIC_OR_DEPARTMENT_OTHER)
Admission: RE | Disposition: A | Discharge status: Home / Self Care | Payer: Self-pay | Source: Home / Self Care | Attending: Otolaryngology

## 2023-11-07 ENCOUNTER — Ambulatory Visit (HOSPITAL_BASED_OUTPATIENT_CLINIC_OR_DEPARTMENT_OTHER)
Admission: RE | Admit: 2023-11-07 | Discharge: 2023-11-07 | Disposition: A | Discharge status: Home / Self Care | Payer: Managed Care, Other (non HMO) | Attending: Otolaryngology | Admitting: Otolaryngology

## 2023-11-07 ENCOUNTER — Other Ambulatory Visit: Payer: Self-pay

## 2023-11-07 ENCOUNTER — Encounter (HOSPITAL_BASED_OUTPATIENT_CLINIC_OR_DEPARTMENT_OTHER): Payer: Self-pay

## 2023-11-07 DIAGNOSIS — J328 Other chronic sinusitis: Secondary | ICD-10-CM | POA: Diagnosis not present

## 2023-11-07 DIAGNOSIS — J343 Hypertrophy of nasal turbinates: Secondary | ICD-10-CM | POA: Diagnosis not present

## 2023-11-07 DIAGNOSIS — J320 Chronic maxillary sinusitis: Secondary | ICD-10-CM | POA: Insufficient documentation

## 2023-11-07 DIAGNOSIS — J3489 Other specified disorders of nose and nasal sinuses: Secondary | ICD-10-CM | POA: Diagnosis not present

## 2023-11-07 DIAGNOSIS — G4730 Sleep apnea, unspecified: Secondary | ICD-10-CM | POA: Insufficient documentation

## 2023-11-07 DIAGNOSIS — J342 Deviated nasal septum: Secondary | ICD-10-CM | POA: Insufficient documentation

## 2023-11-07 DIAGNOSIS — J0121 Acute recurrent ethmoidal sinusitis: Secondary | ICD-10-CM | POA: Diagnosis not present

## 2023-11-07 DIAGNOSIS — Z6841 Body Mass Index (BMI) 40.0 and over, adult: Secondary | ICD-10-CM | POA: Insufficient documentation

## 2023-11-07 DIAGNOSIS — E66813 Obesity, class 3: Secondary | ICD-10-CM | POA: Insufficient documentation

## 2023-11-07 DIAGNOSIS — J322 Chronic ethmoidal sinusitis: Secondary | ICD-10-CM | POA: Diagnosis not present

## 2023-11-07 DIAGNOSIS — I472 Ventricular tachycardia, unspecified: Secondary | ICD-10-CM | POA: Insufficient documentation

## 2023-11-07 DIAGNOSIS — J0101 Acute recurrent maxillary sinusitis: Secondary | ICD-10-CM | POA: Diagnosis not present

## 2023-11-07 DIAGNOSIS — G4733 Obstructive sleep apnea (adult) (pediatric): Secondary | ICD-10-CM | POA: Diagnosis not present

## 2023-11-07 DIAGNOSIS — M1990 Unspecified osteoarthritis, unspecified site: Secondary | ICD-10-CM | POA: Diagnosis not present

## 2023-11-07 HISTORY — DX: Sleep apnea, unspecified: G47.30

## 2023-11-07 HISTORY — PX: SEPTOPLASTY WITH ETHMOIDECTOMY, AND MAXILLARY ANTROSTOMY: SHX6090

## 2023-11-07 HISTORY — PX: SINUS ENDO WITH FUSION: SHX5329

## 2023-11-07 HISTORY — PX: TURBINATE REDUCTION: SHX6157

## 2023-11-07 SURGERY — FESS, WITH MAXILLARY ANTROSTOMY, SEPTOPLASTY, AND ETHMOIDECTOMY
Anesthesia: General | Site: Nose

## 2023-11-07 MED ORDER — SALINE SPRAY 0.65 % NA SOLN: 2.0000 | NASAL | 0 refills | Status: DC | PRN

## 2023-11-07 MED ORDER — FENTANYL CITRATE (PF) 100 MCG/2ML IJ SOLN
INTRAMUSCULAR | Status: AC
  Filled 2023-11-07: qty 2

## 2023-11-07 MED ORDER — MIDAZOLAM HCL 2 MG/2ML IJ SOLN
INTRAMUSCULAR | Status: AC
  Filled 2023-11-07: qty 2

## 2023-11-07 MED ORDER — MUPIROCIN 2 % EX OINT
TOPICAL_OINTMENT | CUTANEOUS | Status: DC | PRN
  Administered 2023-11-07: 1 via NASAL

## 2023-11-07 MED ORDER — FENTANYL CITRATE (PF) 100 MCG/2ML IJ SOLN: 25.0000 ug | INTRAMUSCULAR | Status: DC | PRN

## 2023-11-07 MED ORDER — TRAMADOL HCL 50 MG PO TABS: 50.0000 mg | ORAL_TABLET | Freq: Four times a day (QID) | ORAL | 0 refills | Status: DC | PRN

## 2023-11-07 MED ORDER — LIDOCAINE-EPINEPHRINE 1 %-1:100000 IJ SOLN
INTRAMUSCULAR | Status: AC
  Filled 2023-11-07: qty 1

## 2023-11-07 MED ORDER — ACETAMINOPHEN 500 MG PO TABS
1000.0000 mg | ORAL_TABLET | Freq: Once | ORAL | Status: AC
  Administered 2023-11-07: 1000 mg via ORAL

## 2023-11-07 MED ORDER — FENTANYL CITRATE (PF) 100 MCG/2ML IJ SOLN
INTRAMUSCULAR | Status: DC | PRN
  Administered 2023-11-07: 50 ug via INTRAVENOUS
  Administered 2023-11-07: 100 ug via INTRAVENOUS
  Administered 2023-11-07 (×3): 50 ug via INTRAVENOUS

## 2023-11-07 MED ORDER — LACTATED RINGERS IV SOLN: INTRAVENOUS | Status: DC

## 2023-11-07 MED ORDER — FLUORESCEIN SODIUM 1 MG OP STRP
ORAL_STRIP | OPHTHALMIC | Status: DC | PRN
  Administered 2023-11-07: 1

## 2023-11-07 MED ORDER — ACETAMINOPHEN 500 MG PO TABS
ORAL_TABLET | ORAL | Status: AC
  Filled 2023-11-07: qty 2

## 2023-11-07 MED ORDER — EPINEPHRINE HCL (NASAL) 0.1 % NA SOLN
NASAL | Status: AC
  Filled 2023-11-07: qty 10

## 2023-11-07 MED ORDER — EPHEDRINE SULFATE (PRESSORS) 50 MG/ML IJ SOLN
INTRAMUSCULAR | Status: DC | PRN
  Administered 2023-11-07: 5 mg via INTRAVENOUS
  Administered 2023-11-07 (×2): 10 mg via INTRAVENOUS

## 2023-11-07 MED ORDER — OXYCODONE HCL 5 MG PO TABS: 5.0000 mg | ORAL_TABLET | Freq: Once | ORAL | Status: DC | PRN

## 2023-11-07 MED ORDER — ROCURONIUM BROMIDE 10 MG/ML (PF) SYRINGE
PREFILLED_SYRINGE | INTRAVENOUS | Status: AC
Start: 2023-11-07 — End: ?
  Filled 2023-11-07: qty 10

## 2023-11-07 MED ORDER — SODIUM CHLORIDE 0.9 % IV SOLN
INTRAVENOUS | Status: AC | PRN
Start: 2023-11-07 — End: 2023-11-07
  Administered 2023-11-07: 100 mL

## 2023-11-07 MED ORDER — LIDOCAINE 2% (20 MG/ML) 5 ML SYRINGE
INTRAMUSCULAR | Status: AC
  Filled 2023-11-07: qty 5

## 2023-11-07 MED ORDER — DEXAMETHASONE SODIUM PHOSPHATE 10 MG/ML IJ SOLN
INTRAMUSCULAR | Status: DC | PRN
  Administered 2023-11-07: 10 mg via INTRAVENOUS

## 2023-11-07 MED ORDER — EPHEDRINE 5 MG/ML INJ
INTRAVENOUS | Status: AC
  Filled 2023-11-07: qty 5

## 2023-11-07 MED ORDER — ONDANSETRON HCL 4 MG/2ML IJ SOLN
INTRAMUSCULAR | Status: DC | PRN
  Administered 2023-11-07: 4 mg via INTRAVENOUS

## 2023-11-07 MED ORDER — PHENYLEPHRINE 80 MCG/ML (10ML) SYRINGE FOR IV PUSH (FOR BLOOD PRESSURE SUPPORT)
PREFILLED_SYRINGE | INTRAVENOUS | Status: AC
  Filled 2023-11-07: qty 10

## 2023-11-07 MED ORDER — ONDANSETRON HCL 4 MG/2ML IJ SOLN
INTRAMUSCULAR | Status: AC
  Filled 2023-11-07: qty 2

## 2023-11-07 MED ORDER — ACETAMINOPHEN 500 MG PO TABS: 1000.0000 mg | ORAL_TABLET | Freq: Four times a day (QID) | ORAL | 0 refills | Status: DC | PRN

## 2023-11-07 MED ORDER — MUPIROCIN 2 % EX OINT
TOPICAL_OINTMENT | CUTANEOUS | Status: AC
  Filled 2023-11-07: qty 22

## 2023-11-07 MED ORDER — PROPOFOL 10 MG/ML IV BOLUS
INTRAVENOUS | Status: DC | PRN
  Administered 2023-11-07: 200 mg via INTRAVENOUS

## 2023-11-07 MED ORDER — SODIUM CHLORIDE (PF) 0.9 % IJ SOLN
INTRAMUSCULAR | Status: AC
  Filled 2023-11-07: qty 20

## 2023-11-07 MED ORDER — EPINEPHRINE PF 1 MG/ML IJ SOLN
INTRAMUSCULAR | Status: AC
  Filled 2023-11-07: qty 1

## 2023-11-07 MED ORDER — SUGAMMADEX SODIUM 200 MG/2ML IV SOLN
INTRAVENOUS | Status: DC | PRN
  Administered 2023-11-07: 400 mg via INTRAVENOUS

## 2023-11-07 MED ORDER — IBUPROFEN 200 MG PO TABS: 400.0000 mg | ORAL_TABLET | Freq: Four times a day (QID) | ORAL | 0 refills | Status: DC | PRN

## 2023-11-07 MED ORDER — LIDOCAINE 2% (20 MG/ML) 5 ML SYRINGE
INTRAMUSCULAR | Status: DC | PRN
  Administered 2023-11-07: 100 mg via INTRAVENOUS

## 2023-11-07 MED ORDER — DEXTROSE 5 % IV SOLN
INTRAVENOUS | Status: DC | PRN
  Administered 2023-11-07: 3 g via INTRAVENOUS

## 2023-11-07 MED ORDER — LIDOCAINE-EPINEPHRINE 1 %-1:100000 IJ SOLN
INTRAMUSCULAR | Status: DC | PRN
  Administered 2023-11-07: 15 mL

## 2023-11-07 MED ORDER — PROPOFOL 10 MG/ML IV BOLUS
INTRAVENOUS | Status: AC
  Filled 2023-11-07: qty 20

## 2023-11-07 MED ORDER — DROPERIDOL 2.5 MG/ML IJ SOLN: 0.6250 mg | Freq: Once | INTRAMUSCULAR | Status: DC | PRN

## 2023-11-07 MED ORDER — LIDOCAINE-EPINEPHRINE (PF) 1 %-1:200000 IJ SOLN
INTRAMUSCULAR | Status: AC
  Filled 2023-11-07: qty 30

## 2023-11-07 MED ORDER — 0.9 % SODIUM CHLORIDE (POUR BTL) OPTIME
TOPICAL | Status: DC | PRN
  Administered 2023-11-07: 1000 mL

## 2023-11-07 MED ORDER — ROCURONIUM BROMIDE 100 MG/10ML IV SOLN
INTRAVENOUS | Status: DC | PRN
  Administered 2023-11-07: 10 mg via INTRAVENOUS
  Administered 2023-11-07: 30 mg via INTRAVENOUS
  Administered 2023-11-07: 20 mg via INTRAVENOUS
  Administered 2023-11-07: 80 mg via INTRAVENOUS
  Administered 2023-11-07 (×2): 10 mg via INTRAVENOUS
  Administered 2023-11-07: 20 mg via INTRAVENOUS

## 2023-11-07 MED ORDER — PHENYLEPHRINE HCL (PRESSORS) 10 MG/ML IV SOLN
INTRAVENOUS | Status: DC | PRN
  Administered 2023-11-07 (×2): 80 ug via INTRAVENOUS
  Administered 2023-11-07: 160 ug via INTRAVENOUS
  Administered 2023-11-07: 80 ug via INTRAVENOUS
  Administered 2023-11-07: 160 ug via INTRAVENOUS

## 2023-11-07 MED ORDER — MIDAZOLAM HCL 5 MG/5ML IJ SOLN
INTRAMUSCULAR | Status: DC | PRN
  Administered 2023-11-07: 2 mg via INTRAVENOUS

## 2023-11-07 MED ORDER — CEFAZOLIN SODIUM 1 G IJ SOLR
INTRAMUSCULAR | Status: AC
Start: 2023-11-07 — End: ?
  Filled 2023-11-07: qty 30

## 2023-11-07 MED ORDER — OXYCODONE HCL 5 MG/5ML PO SOLN: 5.0000 mg | Freq: Once | ORAL | Status: DC | PRN

## 2023-11-07 MED ORDER — EPINEPHRINE HCL (NASAL) 0.1 % NA SOLN
NASAL | Status: DC | PRN
  Administered 2023-11-07 (×2): 10 mL via NASAL

## 2023-11-07 MED ORDER — FLUORESCEIN SODIUM 1 MG OP STRP
ORAL_STRIP | OPHTHALMIC | Status: AC
  Filled 2023-11-07: qty 1

## 2023-11-07 MED ORDER — DEXAMETHASONE SODIUM PHOSPHATE 10 MG/ML IJ SOLN
INTRAMUSCULAR | Status: AC
  Filled 2023-11-07: qty 1

## 2023-11-07 SURGICAL SUPPLY — 72 items
ANTIFOG SOL W/FOAM PAD STRL (MISCELLANEOUS) ×2 IMPLANT
BALLN FRONTAL NUVENT 6X17 (BALLOONS) IMPLANT
BALLN FRONTAL NUVENT 6X17 70D (BALLOONS) IMPLANT
BALLOON FRONTAL NUVENT 6X17 (BALLOONS) IMPLANT
BALLOON FRONTAL NVNT 6X17 70D (BALLOONS) IMPLANT
BLADE INF TURB ROT M4 2 5PK (BLADE) IMPLANT
BLADE RAD60 ROTATE M4 4 5PK (BLADE) IMPLANT
BLADE ROTATE RAD 40 4 M4 (BLADE) IMPLANT
BLADE ROTATE TRICUT 4X13 M4 (BLADE) ×2 IMPLANT
BLADE SURG 15 STRL LF DISP TIS (BLADE) IMPLANT
BLADE TRICUT ROTATE M4 4 5PK (BLADE) IMPLANT
CANISTER SUC SOCK COL 7IN (MISCELLANEOUS) ×2 IMPLANT
CANISTER SUCT 1200ML W/VALVE (MISCELLANEOUS) ×4 IMPLANT
COAGULATOR SUCT 8FR VV (MISCELLANEOUS) IMPLANT
DEFOGGER MIRROR 1QT (MISCELLANEOUS) IMPLANT
DRESSING NASAL KENNEDY 3.5X.9 (MISCELLANEOUS) IMPLANT
DRSG NASAL KENNEDY 3.5X.9 (MISCELLANEOUS) IMPLANT
DRSG NASOPORE 8CM (GAUZE/BANDAGES/DRESSINGS) IMPLANT
DRSG TELFA 3X8 NADH STRL (GAUZE/BANDAGES/DRESSINGS) IMPLANT
ELECT COATED BLADE 2.86 ST (ELECTRODE) IMPLANT
ELECT REM PT RETURN 9FT ADLT (ELECTROSURGICAL) ×2 IMPLANT
ELECTRODE REM PT RTRN 9FT ADLT (ELECTROSURGICAL) ×2 IMPLANT
GAUZE SPONGE 2X2 STRL 8-PLY (GAUZE/BANDAGES/DRESSINGS) ×2 IMPLANT
GLOVE BIO SURGEON STRL SZ 6.5 (GLOVE) ×2 IMPLANT
GOWN STRL REUS W/ TWL LRG LVL3 (GOWN DISPOSABLE) ×4 IMPLANT
HEMOSTAT ARISTA ABSORB 3G PWDR (HEMOSTASIS) IMPLANT
HEMOSTAT SURGICEL .5X2 ABSORB (HEMOSTASIS) IMPLANT
INFLATOR BALLOON W/TUBE (BALLOONS) IMPLANT
IV NS 500ML BAXH (IV SOLUTION) ×2 IMPLANT
NDL HYPO 25X1 1.5 SAFETY (NEEDLE) IMPLANT
NDL PRECISIONGLIDE 27X1.5 (NEEDLE) ×2 IMPLANT
NDL SAFETY ECLIPSE 18X1.5 (NEEDLE) IMPLANT
NDL SPNL 25GX3.5 QUINCKE BL (NEEDLE) ×2 IMPLANT
NEEDLE HYPO 25X1 1.5 SAFETY (NEEDLE) ×2 IMPLANT
NEEDLE PRECISIONGLIDE 27X1.5 (NEEDLE) ×2 IMPLANT
NEEDLE SPNL 25GX3.5 QUINCKE BL (NEEDLE) ×2 IMPLANT
NS IRRIG 1000ML POUR BTL (IV SOLUTION) ×2 IMPLANT
PACK BASIN DAY SURGERY FS (CUSTOM PROCEDURE TRAY) ×2 IMPLANT
PACK ENT DAY SURGERY (CUSTOM PROCEDURE TRAY) ×2 IMPLANT
PAD MAGNETIC INSTR ST 16X20 (MISCELLANEOUS) IMPLANT
PATTIES SURGICAL .5 X3 (DISPOSABLE) ×2 IMPLANT
PENCIL SMOKE EVACUATOR (MISCELLANEOUS) IMPLANT
SHEATH ENDOSCRUB 0 DEG (SHEATH) IMPLANT
SHEATH ENDOSCRUB 30 DEG (SHEATH) IMPLANT
SHEET MEDIUM DRAPE 40X70 STRL (DRAPES) IMPLANT
SHEET SILICONE 2X3 0.03 REINF (MISCELLANEOUS) IMPLANT
SLEEVE SCD COMPRESS KNEE MED (STOCKING) ×2 IMPLANT
SOLUTION ANTFG W/FOAM PAD STRL (MISCELLANEOUS) ×2 IMPLANT
SPIKE FLUID TRANSFER (MISCELLANEOUS) IMPLANT
SPLINT NASAL AIRWAY SILICONE (MISCELLANEOUS) ×2 IMPLANT
SPLINT NASAL POSISEP X .6X2 (GAUZE/BANDAGES/DRESSINGS) IMPLANT
SPONGE NEURO XRAY DETECT 1X3 (DISPOSABLE) IMPLANT
SUT CHROMIC 2 0 SH (SUTURE) IMPLANT
SUT CHROMIC 4 0 P 3 18 (SUTURE) ×2 IMPLANT
SUT ETHILON 2 0 FS 18 (SUTURE) IMPLANT
SUT ETHILON 3 0 FSL (SUTURE) IMPLANT
SUT ETHILON 6 0 P 1 (SUTURE) IMPLANT
SUT PLAIN 4 0 ~~LOC~~ 1 (SUTURE) ×2 IMPLANT
SUT SILK 2 0 SH (SUTURE) IMPLANT
SWAB COLLECTION DEVICE MRSA (MISCELLANEOUS) IMPLANT
SWAB CULTURE ESWAB REG 1ML (MISCELLANEOUS) IMPLANT
SYR 3ML 18GX1 1/2 (SYRINGE) ×4 IMPLANT
SYR TB 1ML LL NO SAFETY (SYRINGE) IMPLANT
TOWEL GREEN STERILE FF (TOWEL DISPOSABLE) ×2 IMPLANT
TRACKER ENT INSTRUMENT (MISCELLANEOUS) ×2 IMPLANT
TRACKER ENT PATIENT (MISCELLANEOUS) ×2 IMPLANT
TRAY DSU PREP LF (CUSTOM PROCEDURE TRAY) ×2 IMPLANT
TUBE CONNECTING 20X1/4 (TUBING) ×2 IMPLANT
TUBE SALEM SUMP 12FR 48 (TUBING) IMPLANT
TUBE SALEM SUMP 16F (TUBING) ×2 IMPLANT
TUBING STRAIGHTSHOT EPS 5PK (TUBING) IMPLANT
YANKAUER SUCT BULB TIP NO VENT (SUCTIONS) ×2 IMPLANT

## 2023-11-07 NOTE — Anesthesia Postprocedure Evaluation (Signed)
 Anesthesia Post Note  Patient: [name]  Procedure(s) Performed: FESS, WITH MAXILLARY ANTROSTOMY, SEPTOPLASTY, AND ETHMOIDECTOMY (Bilateral: Nose) REDUCTION, NASAL TURBINATE (Bilateral: Nose) SURGERY, PARANASAL SINUS, ENDOSCOPIC, WITH NASAL SEPTOPLASTY, TURBINOPLASTY, AND MAXILLARY SINUSOTOMY (Nose)     Patient location during evaluation: PACU Anesthesia Type: General Level of consciousness: awake and alert Pain management: pain level controlled Vital Signs Assessment: post-procedure vital signs reviewed and stable Respiratory status: spontaneous breathing, nonlabored ventilation and respiratory function stable Cardiovascular status: blood pressure returned to baseline Postop Assessment: no apparent nausea or vomiting Anesthetic complications: no   No notable events documented.  Last Vitals:  Vitals:   11/07/23 1600 11/07/23 1615  BP: 135/82 128/76  Pulse: 72 77  Resp: 17 14  Temp:    SpO2: 96% 97%    Last Pain:  Vitals:   11/07/23 1056  PainSc: 1                  [name]

## 2023-11-07 NOTE — H&P (Signed)
 Pre-Operative H&P - Day Of Surgery Patient Name: Kyle Duncan Date:   11/07/2023  HPI: Kyle Duncan is a 48 y.o. male who presents today for operative treatment of nasal obstruction, nasal septal deviation, bilateral sinusitis. Patient denies recent significant changes to health or significant new medications or physiologic change in condition which would immediately impact plans. No new types of therapy has been initiated that would change the plan or the appropriateness of the plan.   ROS:  A complete review of systems was obtained and is otherwise negative.   PMH:  Past Medical History:  Diagnosis Date   H/O: vasectomy 06/09/2016   01/2013   PSVT (paroxysmal supraventricular tachycardia) (HCC)    Recurrent upper respiratory infection (URI)    Sleep apnea     PSH:  Past Surgical History:  Procedure Laterality Date   CARDIAC CATHETERIZATION     negative cath 2012 [facility]   COLONOSCOPY      MEDS:   Current Facility-Administered Medications:    lactated ringers infusion, , Intravenous, Continuous, Bethena Midget, MD, Last Rate: 10 mL/hr at 11/07/23 1134, Continued from Pre-op at 11/07/23 1134  ALLERGIES: Bee pollen and Dust mite mixed allergen ext [mite (d. farinae)]  EXAM: Vitals: BP 132/71   Pulse (!) 56   Temp 98.6 F (37 C)   Resp 16   Ht 5\' 9"  (1.753 m)   Wt (!) 141.8 kg   SpO2 99%   BMI 46.16 kg/m   General Awake, at baseline alertness.   HEENT No scleral icterus or conjunctival hemorrhage. Globe position appears normal. External ears  normal. Nose patent without rhinorrhea. No lymphadenopathy. No thyromegaly  Cardiovascular No cyanosis.  Pulmonary No audible stridor. Breathing easily with no labor.  Neuro Symmetric facial movement.   Psychiatry Appropriate affect and mood.  Skin No scars or lesions on face or neck.  Extermities Moves all extremities with normal range of motion.   Other Findings None.   Assessment & Plan: Kyle Duncan has diagnoses of nasal  obstruction, nasal septal deviation, bilateral sinusitis and will go to the OR today for bilateral fess, septoplasty, bilateral inferior turbinate reduction. Informed consent was obtained and available in EMR today. All questions have been answered, and risks/benefits/alternatives of procedure as noted in the consent were discussed in a quiet area. Questions were invited and answered. The patient expressed understanding, provided consent and wished to proceed despite risks.  Read Drivers 11/07/2023 11:38 AM

## 2023-11-07 NOTE — Transfer of Care (Signed)
 Immediate Anesthesia Transfer of Care Note  Patient: Kyle Duncan  Procedure(s) Performed: FESS, WITH MAXILLARY ANTROSTOMY, SEPTOPLASTY, AND ETHMOIDECTOMY (Bilateral: Nose) REDUCTION, NASAL TURBINATE (Bilateral: Nose) SURGERY, PARANASAL SINUS, ENDOSCOPIC, WITH NASAL SEPTOPLASTY, TURBINOPLASTY, AND MAXILLARY SINUSOTOMY (Nose)  Patient Location: PACU  Anesthesia Type:General  Level of Consciousness: drowsy  Airway & Oxygen Therapy: Patient Spontanous Breathing and Patient connected to face mask oxygen  Post-op Assessment: Report given to RN and Post -op Vital signs reviewed and stable  Post vital signs: Reviewed and stable  Last Vitals:  Vitals Value Taken Time  BP 138/75 11/07/23 1551  Temp 36.2 C 11/07/23 1551  Pulse 75 11/07/23 1551  Resp 18 11/07/23 1551  SpO2 95 % 11/07/23 1551    Last Pain:  Vitals:   11/07/23 1056  PainSc: 1       Patients Stated Pain Goal: 6 (11/07/23 1056)  Complications: No notable events documented.

## 2023-11-07 NOTE — Anesthesia Procedure Notes (Signed)
 Procedure Name: Intubation Date/Time: 11/07/2023 12:20 PM  Performed by: Lauralyn Primes, CRNAPre-anesthesia Checklist: Patient identified, Emergency Drugs available, Suction available and Patient being monitored Patient Re-evaluated:Patient Re-evaluated prior to induction Oxygen Delivery Method: Circle system utilized Preoxygenation: Pre-oxygenation with 100% oxygen Induction Type: IV induction Ventilation: Mask ventilation without difficulty and Oral airway inserted - appropriate to patient size Laryngoscope Size: Mac and 4 Grade View: Grade II Tube type: Oral Tube size: 7.5 mm Number of attempts: 1 Airway Equipment and Method: Stylet, Oral airway and Bite block Placement Confirmation: ETT inserted through vocal cords under direct vision, positive ETCO2 and breath sounds checked- equal and bilateral Secured at: 23 cm Tube secured with: Tape Dental Injury: Teeth and Oropharynx as per pre-operative assessment

## 2023-11-07 NOTE — Discharge Instructions (Addendum)
 Surgery Discharge Instructions (Dr. Allena Katz)  1. Call your doctor or go to the emergency room if you have: - Fever of 101.5 degrees or higher - Severe pain that has increased greatly since your surgery or is uncontrolled by your current pain medications - Increasing or concerning amount of bleeding from your nose. You should expect some bloody drainage from your nose for 1-3 days. Even a 5-10 minute trickle nose bleed is expected after surgery - Nausea and vomiting that does not go away - Chest pain/shortness of breath - Any other acute events, problems, or concerns  2. Wound Care/Dressings/Drain Instructions:  - It is OK to shower following your surgery. - You have a small amount of dissolvable packing in your nose. The remainder of this may be removed in clinic, otherwise this does not need to come out. - You should begin using nasal saline spray as needed once you no longer have bleeding from the nose. This is usually 1-2 days after your surgery. -There are small soft plastic splints in your nose to hold your septum in the middle. These may crust a bit, which can be reduced by using nasal saline spray.  - Do not use your CPAP for 7 days after surgery. Sleep with a couple of pillows to prop you up   3. Follow Up: - A follow up appointment will be scheduled for you with Dr. Allena Katz. If you do not know the date/time, please contact our office  4. Activity/Restrictions: - Resume your regular activities, as tolerated - Avoid heavy lifting, bending over, manipulating your nose, blowing your nose, sneezing with your mouth closed, straining and strenuous activities until instructed otherwise. Do not lift more than 10 lbs for 10 days  5. Diet: - Resume your regular diet, as tolerated  6. Additional Instructions: - You should complete the course of antibiotic/steroid prescribed to you before surgery - Use acetaminophen/Tylenol and ibuprofen to control your pain. If this does not bring you relief  or the pain remains severe, a stronger pain medication (Tramadol - 50mg  tablet every 6 hours as needed) has been prescribed to you. - DO NOT MIX NARCOTIC PAIN MEDICATIONS OR TAKE NARCOTIC PRESCRIPTIONS AT THE SAME TIME (PERCODET, LORTAB, ROXICODONE, ETC.) - DO NOT DRIVE OR OPERATE HEAVY MACHINERY WHILE ON NARCOTICS - DO NOT TAKE MORE THAN 4 GRAMS (4000mg ) OF TYLENOL (ACETAMINOPHEN) IN 24 HOURS  ________________________________________________________________  Department of Otolaryngology Contact Info: Otolaryngology Nursing Triage (Monday-Friday daytime working hours or for emergencies after hours) [phone]  Post Anesthesia Home Care Instructions  Activity: Get plenty of rest for the remainder of the day. A responsible adult should stay with you for 24 hours following the procedure.  For the next 24 hours, DO NOT: -Drive a car -Advertising copywriter -Drink alcoholic beverages -Take any medication unless instructed by your physician -Make any legal decisions or sign important papers.  Meals: Start with liquid foods such as gelatin or soup. Progress to regular foods as tolerated. Avoid greasy, spicy, heavy foods. If nausea and/or vomiting occur, drink only clear liquids until the nausea and/or vomiting subsides. Call your physician if vomiting continues.  Special Instructions/Symptoms: Your throat may feel dry or sore from the anesthesia or the breathing tube placed in your throat during surgery. If this causes discomfort, gargle with warm salt water. The discomfort should disappear within 24 hours.

## 2023-11-07 NOTE — Op Note (Signed)
 Otolaryngology Operative note  Geoge Lawrance Date/Time of Admission: 11/07/2023 10:34 AM  CSN: [id number];MRN:[id number]  DOB: [birth year] Age: 48 y.o. Location: [facility]    Pre-Op Diagnosis: Nasal Septal Deviation Nasal obstruction Bilateral inferior turbinate hypertrophy Bilateral maxillary sinusitis (recurrent acute) Bilateral ethmoid sinusitis (recurrent acute)  Post-Op Diagnosis: Same  Procedure: Procedure(s): Endoscopic Septoplasty (CPT [phone]) Bilateral nasal endoscopy with Maxillary Antrostomy with removal of tissue (CPT 31267 - 50) Bilateral nasal endoscopy with Anterior (partial Ethmoidectomy (CPT 31254 - 50) Bilateral nasal endoscopy with bilateral submucous inferior turbinate reduction with outfracture (CPT 30140) Stereotactic Image Guidance (CPT [phone])  Surgeon: Jovita Kussmaul, MD  Anesthesia type:  General  Anesthesiologist: Anesthesiologist: Kaylyn Layer, MD CRNA: Lauralyn Primes, CRNA   Staff: Circulator: Raliegh Scarlet, RN; Albin Felling, RN Scrub Person: Mickeal Needy Vendor Representative : Alene Mires  Implants: Ralph Leyden Splints - Bilateral  Specimens: ID Type Source Tests Collected by Time Destination  1 : Bilateral sinus contents Tissue PATH Sinus Contents/Nasal Polyps SURGICAL PATHOLOGY Read Drivers, MD 11/07/2023 1400     EBL:  200 mL   Post-op disposition and condition: PACU, hemodynamically stable   Complications: None apparent  Indications and consent:  Jakaree Pickard is a 48 y.o. male with diagnoses above with persistent nasal obstruction, nasal septal deviation, bilateral inferior turbinate hypertrophy, and sinusitis despite medical management. The patient's options were discussed, including risks/benefits/alternatives for each option. Patient expressed understanding, and despite these risks, consented and decided to proceed with above procedures. Informed consent was signed before  proceeding.  Findings: Sinuous septum - left caudal, right posterior deviation; L > R inferior turbinate hypertrophy; nasal airway much improved after septoplasty and inferior turbinate reduction Bilateral maxillary sinuses with mucous retention cysts; bilateral maxillary antrostomy and partial ethmoidectomy performed  Procedure: Anesthesia and Prep -- After being properly identified in the preoperative holding area, the patient was brought into the operating suite. He was placed supine on the operating table. A pre-procedural time-out was performed. Pre-operative antibiotics and steroids were administered. After induction of general anesthesia, the patient was successfully intubated with confirmation of tube placement via CO2 return. Eyes were taped closed with tape. Pledgets soaked in 1:1000 fluorescein dyed epinephrine were placed in each nostril. The face was prepped and draped in usual fashion for sinus surgery.  Image Guidance Registration and Prep -- Given disease abutting the lamina papyracea and in ethmoid cavity, image guidance was necessary for the case. The registration sticker for the Stereotactic image guidance system was placed on the forehead. The face was registered with good correlation. Landmarks were checked with the probe which showed satisfactory accuracy.   Endoscopic-assisted septoplasty  -- The nose was decongested with epinephrine 1:1000 soaked pledgets. The septum was injected bilaterally with 1% lidocaine with 1:100,000 epinephrine. A 15 blade was utilized to make a hemi-transfixion incision in the left nasal cavity. A Cottle elevator was then used to elevate a sub-mucoperichondrial flap. Once adequate room was available, the 0 degree endoscopic was placed into the flap and the septal cartilage was visualized. The suction freer was used to raise the flap back to the bony-cartilaginous junction. A caudal transcartilaginous cut was made using the Cottle elevator taking care to  leave at least a 1cm caudal strut. The opposing mucoperichondrial flap was then elevated off the cartilage and bone of the right side. Then, a Lenoria Chime forcep was used to cut away the cartilage working anterior to posterior. As the cartilage was removed superiorly, care was taken to  leave at least a 1cm dorsal strut.  A Jansen-Middleton double-action rongeur was used to remove the bony aspect of the deviated septum.  During elevation, there was a small tear in the right mucoperichondrial flap overlying the previous rightward spur.  There was no opposing defect created.  Bony maxillary crest was identified and partially removed with a Lenoria Chime forcep. The nose was then inspected and the septum was seen to be midline. The previous nasal obstruction was much improved. At the conclusion of the case, the hemitransfixion incision was closed with interrupted 5-0 chromic gut suture and doyle splints coated in mupirocin were placed bilaterally and sutured to the anterior septum with a 2-0 nylon suture  Bilateral submucous inferior turbinate reduction with microdebrider and outfracture -- 1% lidocaine with 1:100,000 units of epinephrine was injected into the bilateral inferior turbinates. A 15 blade was used to make an incision in the head of the left inferior turbinate. A cottle elevator was then used to elevate a submucosal flap along the medial surface of the turbinate. A microdebrider turbinate blade was inserted into the left turbinate and pieces of bone and soft tissue of the turbinate were resected in a submucosal fashion. Particular attention was paid to the head of the turbinate to improve nasal valve airflow. Once adequate resection was achieved, a Engineering geologist was used to out-fracture the inferior turbinate. This procedure was then repeated on the right side to complete the bilateral submucosal inferior turbinate resection. The nasal airway was then seen to be widely patent.  Left nasal endoscopy with  maxillary antrostomy with removal of tissue -- 1% Lidocaine with 1:100,000 epinephrine was used to infiltrate the axilla of the middle turbinate, head of the middle turbinate and the sphenopalatine artery. The middle turbinate was medialized using a freer. The maxillary seeker was inserted and slipped behind the uncinate process. This was pulled anteriorly exposing the edge. A pediatric back-biter was inserted and the uncinate was cut anteriorly toward but not into the nasolacrimal duct. The microdebrider was used to remove the uncinate process above and below the cut made by the back-biter. A curved suction was inserted into the maxillary sinus through the antrostomy/natural os and pushed posteriorly and inferiorly to enlarge the opening. The microdebrider was used to clean up the edges of bone and mucosa. A 30 and 45 degree endoscope was then attached and used to view the sinus. There were maxillary sinus mucous retention cysts which were removed with the Giraffe forceps. The natural os was identified and the maxillary antrostomy was complete.  The sinus was copiously irrigated with normal saline until clear.  Left nasal endoscopy with anterior (partial) ethmoidectomy -- The 0 degree endoscope was reattached and the ethmoid bulla was identified visually as well as confirmed via the image guidance system. The microdebrider was used to open the bulla working from medial and inferior to lateral and superior. Removal extended laterally near the lamina papyracea and superiorly toward skull base. Ethmoid cells were opened in succession using image guidance intermittently to verify location to open the anterior ethmoid cells. The microdebrider was used to clean up the edges of mucosa and bone. The ethmoid cavity was irrigated copiously with normal saline until clear.  Right nasal endoscopy with maxillary antrostomy with removal of tissue -- 1% Lidocaine with 1:100,000 epinephrine was used to infiltrate the axilla of  the middle turbinate, head of the middle turbinate and the sphenopalatine artery. The middle turbinate was medialized using a freer. The maxillary seeker was inserted and  slipped behind the uncinate process. This was pulled anteriorly exposing the edge. A pediatric back-biter was inserted and the uncinate was cut anteriorly toward but not into the nasolacrimal duct. The microdebrider was used to remove the uncinate process above and below the cut made by the back-biter. A curved suction was inserted into the maxillary sinus through the antrostomy/natural os and pushed posteriorly and inferiorly to enlarge the opening. The microdebrider was used to clean up the edges of bone and mucosa. A 30 and 45 degree endoscope was then attached and used to view the sinus. There were maxillary sinus mucous retention cysts which were removed with the Giraffe forceps, which was similar to other side. The natural os was identified and the maxillary antrostomy was complete.  The sinus was copiously irrigated with normal saline until clear  Right nasal endoscopy with anterior (partial) ethmoidectomy -- The 0 degree endoscope was reattached and the ethmoid bulla was identified visually as well as confirmed via the image guidance system. The microdebrider was used to open the bulla working from medial and inferior to lateral and superior. Removal extended laterally near the lamina papyracea and superiorly toward skull base. Ethmoid cells were opened in succession using image guidance intermittently to verify location to open the anterior ethmoid cells. The microdebrider was used to clean up the edges of mucosa and bone. The ethmoid cavity was irrigated copiously with normal saline until clear.  Conclusion -- Both sides were inspected and no orbital fat or evidence of cerebral spinal fluid leak were observed. Both sides were irrigated and clot removed. Two PosiSepX packs were inserted lateral to the middle turbinates bilaterally.  A  flexible suction catheter was used to remove fluid and blood from the oropharynx and hypopharynx.   Tape was removed from the eyelids and the eyes were checked for tension or proptosis, none was observed. The image guidance sticker was removed. The patient's skin was cleaned. He was returned to the care of the anesthesia team. He was then weaned from the anesthetic and transported to the PACU in stable condition.  Read Drivers

## 2023-11-07 NOTE — Anesthesia Preprocedure Evaluation (Addendum)
 Anesthesia Evaluation  Patient identified by MRN, date of birth, ID band Patient awake    Reviewed: Allergy & Precautions, NPO status , Patient's Chart, lab work & pertinent test results  History of Anesthesia Complications Negative for: history of anesthetic complications  Airway Mallampati: II  TM Distance: >3 FB Neck ROM: Full    Dental no notable dental hx.    Pulmonary sleep apnea and Continuous Positive Airway Pressure Ventilation , former smoker   Pulmonary exam normal        Cardiovascular Normal cardiovascular exam+ dysrhythmias Supra Ventricular Tachycardia      Neuro/Psych negative neurological ROS     GI/Hepatic negative GI ROS, Neg liver ROS,,,  Endo/Other    Class 3 obesity  Renal/GU negative Renal ROS     Musculoskeletal  (+) Arthritis ,    Abdominal   Peds  Hematology negative hematology ROS (+)   Anesthesia Other Findings NASAL SEPTAL DEVIATION, NASAL OBSTRUCTION, CHRONIC SINUSITIS  Reproductive/Obstetrics                             Anesthesia Physical Anesthesia Plan  ASA: 3  Anesthesia Plan: General   Post-op Pain Management: Tylenol PO (pre-op)*   Induction: Intravenous  PONV Risk Score and Plan: 2 and Ondansetron, Dexamethasone, Treatment may vary due to age or medical condition and Midazolam  Airway Management Planned: Oral ETT  Additional Equipment: None  Intra-op Plan:   Post-operative Plan: Extubation in OR  Informed Consent: I have reviewed the patients History and Physical, chart, labs and discussed the procedure including the risks, benefits and alternatives for the proposed anesthesia with the patient or authorized representative who has indicated his/her understanding and acceptance.     Dental advisory given  Plan Discussed with: CRNA  Anesthesia Plan Comments:        Anesthesia Quick Evaluation

## 2023-11-08 ENCOUNTER — Encounter (HOSPITAL_BASED_OUTPATIENT_CLINIC_OR_DEPARTMENT_OTHER): Payer: Self-pay | Admitting: Otolaryngology

## 2023-11-09 LAB — SURGICAL PATHOLOGY

## 2023-11-10 ENCOUNTER — Telehealth (INDEPENDENT_AMBULATORY_CARE_PROVIDER_SITE_OTHER): Payer: Self-pay

## 2023-11-10 ENCOUNTER — Telehealth (INDEPENDENT_AMBULATORY_CARE_PROVIDER_SITE_OTHER): Payer: Self-pay | Admitting: Otolaryngology

## 2023-11-10 NOTE — Telephone Encounter (Signed)
 Patient call c/o uvula swelling which started last night. I informed Dr. Allena Katz of patient's s/x and was told to let patient know that "the swelling could be from the feeding tube rubbing against the uvula"."Patient should gargle with salt water and as long as he isn't having any breathing issues he should be fine". Spoke to patient and informed him of Dr. Eliane Decree instructions and patient stated that he "gasp for air last night but uses a CPAP usually and has not being using since the surgery". Patient stated that he will try the salt water gargle. Patient stated that he hasn't had any issues breathing today. Informed patient that if anything changes to give [place] or call or if it's after hours then go to the ER.

## 2023-11-10 NOTE — Telephone Encounter (Signed)
 LVM to confirm appt & location [id number] afm

## 2023-11-11 ENCOUNTER — Encounter (INDEPENDENT_AMBULATORY_CARE_PROVIDER_SITE_OTHER): Payer: Managed Care, Other (non HMO)

## 2023-11-11 ENCOUNTER — Encounter (INDEPENDENT_AMBULATORY_CARE_PROVIDER_SITE_OTHER): Payer: Self-pay

## 2023-11-11 ENCOUNTER — Ambulatory Visit (INDEPENDENT_AMBULATORY_CARE_PROVIDER_SITE_OTHER): Payer: Managed Care, Other (non HMO) | Admitting: Otolaryngology

## 2023-11-11 VITALS — BP 149/80 | HR 55 | Ht 69.0 in | Wt 305.0 lb

## 2023-11-11 DIAGNOSIS — J328 Other chronic sinusitis: Secondary | ICD-10-CM | POA: Diagnosis not present

## 2023-11-11 DIAGNOSIS — J343 Hypertrophy of nasal turbinates: Secondary | ICD-10-CM

## 2023-11-11 DIAGNOSIS — Z9889 Other specified postprocedural states: Secondary | ICD-10-CM

## 2023-11-11 DIAGNOSIS — R0981 Nasal congestion: Secondary | ICD-10-CM

## 2023-11-11 DIAGNOSIS — J3489 Other specified disorders of nose and nasal sinuses: Secondary | ICD-10-CM

## 2023-11-11 DIAGNOSIS — J342 Deviated nasal septum: Secondary | ICD-10-CM

## 2023-11-11 NOTE — Progress Notes (Signed)
 Dear Dr. Larinda Buttery, Here is my assessment for our mutual patient, Derin Granquist. Thank you for allowing me the opportunity to care for your patient. Please do not hesitate to contact me should you have any other questions. Sincerely, Dr. Jovita Kussmaul  Otolaryngology Clinic Note Referring provider: Dr. Larinda Buttery HPI:  Kyle Duncan is a 48 y.o. male kindly referred by Dr. Larinda Buttery for evaluation of chronic sinusitis and allergic rhinitis  Initial visit (07/2023): Patient reports: he reports that he has has had sinus issues for several years and allergies. He saw Dr. Selena Batten in 06/28/2023 for allergies. He reports that every couple of months, he is getting some kind of sinus infection and cold. He starts to have PND, maxillary pressure, nasal stuffiness/congestion and some pressure behind the eyes. It turns into a sinus infection -- then he will have headache, significant congestion, mostly mucoid drainage but intermittent discoloration, maxillary/frontal and ethmoid pressure (mostly max and ethmoid). Sense of smell is not great. He normally does get antibiotics for it (5-6 courses per year). Will also get steroids last 2-3 visits and both antibiotics and steroids help significantly. Allergy symptoms are "all the time" - baseline congested - both sides. Has had allergy testing (2024) - grass, ragweed, weed, trees, dust mites No frequent PNA or ear infections  Of note, he does think he has a deviated septum.  He is using Ryaltris, Zyrtec. He does think it helps some. He has not used saline rinses. Sudafed does not work, mucinex does not work. Has not used singulair. Advil cold/sinus works. He has not used afrin.  Does take supplements  No CT sinus.   Has GERD, pepcid. --------------------------------------------------------- He was treated medically maximally and now returns after post-treatment CT.  09/21/2023 He reports that he is doing ok from sinus standpoint; no discolored drainage, feels like  things have cleared some. No significant pressure. 5-6 infections/year overall still. He is using ryaltris. Antibiotics and steroids helped. Complains of left nasal obstruction. We did discuss his CT. --------------------------------------------------------- 11/11/2023 Seen in POV after septo/turbs and bilateral FESS. Splints removed, some bleeding but reports after removal that he is breathing much better. Finished abx/steroids. ---------------------------------------------------------   Quit smoking 8 years ago  PMHx: Allergies, GERD, PSVT (history), OSA ON CPAP  H&N Surgery: no Personal or FHx of bleeding dz or anesthesia difficulty: no  GLP-1: no AP/AC: no  Tobacco: prior smoker (quit 8 years ago). Alcohol: no. Occupation: Building control surveyor. Lives in [place], [place]  Independent Review of Additional Tests or Records:  Labs (07/01/2023):  Eos 0 IgG, A, M: negative; Complement: normal Strep pneumo titers: low Skin testing 03/29/2023 independently reviewed: + grass, ragweed, weed, trees, dust mites Dr. Elmyra Ricks office notes (Allergy) - 06/28/2023: constant sinus infection. Ryaltris, zyrtec; freq sinus infxn req abx/steroids. Did not try netipot or singulair; uses CPAP. Skin testing + to grass, ragweed, weed, trees, dust mites. Rec; Start singulair, ryaltris, allergy shots and ref to ENT; rec immune workup PCP note Dr. Yetta Barre 05/23/2023: No significant changes, continuing CPAP ED 06/24/2023: nasal drainage, watery eyes; Dx sinusitis; Rx augmentin and steroids and PO antihistamine ED 03/02/2023: Congestion, URI; Rx flonase  MRI Brain 09/11/22 rev independently: chronic b/l L > R sinusitis with sinuous septum; most prominent disease along bilateral max, but some ethmoid, sphenoid and frontal MPT as well  CT Sinus 08/23/2023 independently reviewed and interpreted: left caudal septal deviation, right posterior; bilateral inferior turbinate hypertrophy; trace thickening left  frontal and mild b/l ethmoids; bilateral maxillary sinus opacification  along floors with cyss, narrow OMC; maxillary disease has not resolved as compared to MRI  Path 11/07/2023: chronic inflammation, no malignancy   PMH/Meds/All/SocHx/FamHx/ROS:   Past Medical History:  Diagnosis Date   H/O: vasectomy 06/09/2016   01/2013   PSVT (paroxysmal supraventricular tachycardia) (HCC)    Recurrent upper respiratory infection (URI)    Sleep apnea      Past Surgical History:  Procedure Laterality Date   CARDIAC CATHETERIZATION     [facility]   [facility]ONOSCOPY     SEPTOPLASTY WITH ETHMOIDECTOMY, AND MAXILLARY ANTROSTOMY Bilateral 11/07/2023   Procedure: FESS, WITH MAXILLARY ANTROSTOMY, SEPTOPLASTY, AND ETHMOIDECTOMY;  Surgeon: Read Drivers, MD;  Location: [facility];  Service: ENT;  Laterality: Bilateral;   SINUS ENDO WITH FUSION N/A 11/07/2023   Procedure: SURGERY, PARANASAL SINUS, ENDOSCOPIC, WITH NASAL SEPTOPLASTY, TURBINOPLASTY, AND MAXILLARY SINUSOTOMY;  Surgeon: Read Drivers, MD;  Location: [facility];  Service: ENT;  Laterality: N/A;   TURBINATE REDUCTION Bilateral 11/07/2023   Procedure: REDUCTION, NASAL TURBINATE;  Surgeon: Read Drivers, MD;  Location: [facility];  Service: ENT;  Laterality: Bilateral;    Family History  Problem Relation Age of Onset   Hypertension Mother    Diabetes Mother    Cancer Father    Colon cancer Maternal Aunt    Stroke Paternal Uncle    Diabetes Maternal Grandmother    Hyperlipidemia Maternal Grandmother    Hypertension Maternal Grandmother    Breast cancer Maternal Grandmother    Hypertension Maternal Grandfather    Heart disease Maternal Grandfather    Skin cancer Maternal Grandfather    Heart attack Paternal Grandmother    Stroke Paternal Grandfather    Allergic rhinitis Neg Hx    Asthma Neg Hx    Eczema Neg Hx    Urticaria Neg Hx      Social Connections: Unknown (02/23/2023)    Received from [facility]   Social Network    Social Network: Not on file      Current Outpatient Medications:    acetaminophen (TYLENOL) 500 MG tablet, Take 2 tablets (1,000 mg total) by mouth every 6 (six) hours as needed., Disp: 30 tablet, Rfl: 0   AMBULATORY NON FORMULARY MEDICATION, Continuous positive airway pressure (CPAP) machine set on AutoPAP (10-20 cmH2O), with all supplemental supplies as needed. Please provide a large size Fisher[employer]kel Full Face Simplus Mask., Disp: 1 each, Rfl: 0   calcium-vitamin D (OSCAL WITH D) 500-5 MG-MCG tablet, Take 1 tablet by mouth., Disp: , Rfl:    cyanocobalamin (VITAMIN B12) 500 MCG tablet, Take 500 mcg by mouth daily., Disp: , Rfl:    ibuprofen (ADVIL) 200 MG tablet, Take 2 tablets (400 mg total) by mouth every 6 (six) hours as needed., Disp: 30 tablet, Rfl: 0   MAGNESIUM HYDROXIDE PO, Take by mouth., Disp: , Rfl:    Menaquinone-7 (K2 PO), Take by mouth., Disp: , Rfl:    Olopatadine-Mometasone (RYALTRIS) 665-25 MCG/ACT SUSP, Place 1-2 sprays into the nose in the morning and at bedtime., Disp: 32 g, Rfl: 5   predniSONE (DELTASONE) 20 MG tablet, Take 20 mg by mouth 2 (two) times daily with a meal., Disp: , Rfl:    sodium chloride (OCEAN) 0.65 % SOLN nasal spray, Place 2 sprays into both nostrils as needed for up to 14 days for congestion., Disp: 30 mL, Rfl: 0   tadalafil (CIALIS) 10 MG tablet, Take 1 tablet (10 mg total) by  mouth daily., Disp: 20 tablet, Rfl: 5   traMADol (ULTRAM) 50 MG tablet, Take 1 tablet (50 mg total) by mouth every 6 (six) hours as needed., Disp: 20 tablet, Rfl: 0   Zinc Acetate, Oral, (ZINC ACETATE PO), Take by mouth., Disp: , Rfl:    Physical Exam:   BP (!) 149/80 (BP Location: Left Arm, Patient Position: Sitting)   Pulse (!) 55   Ht 5\' 9"  (1.753 m)   Wt (!) 305 lb (138.3 kg)   SpO2 96%   BMI 45.04 kg/m   Salient findings:  CN II-XII intact  Bilateral EAC clear and TM intact with well pneumatized  middle ear spaces Anterior rhinoscopy: Doyle splints removed, septum more midline and bilateral IT reduced; No lesions of oral cavity/oropharynx; dentition good No respiratory distress or stridor  Seprately Identifiable Procedures:  PROCEDURE: Bilateral Diagnostic Rigid Nasal Endoscopy with Bilateral Debridement Pre-procedure diagnosis: Post-operative examination and care after bilateral Functional Endoscopic Sinus Surgery - of note: this procedure was NOT performed for management of the septoplasty or the turbinate reduction. Post-procedure diagnosis: same Indication: See pre-procedure diagnosis and physical exam above Complications: None apparent EBL: <5 mL Anesthesia: Lidocaine 4% and topical decongestant was topically sprayed in each nasal cavity  Description of Procedure:  Patient was identified. A rigid 30 degree endoscope was utilized to evaluate the sinonasal cavities, mucosa, sinus ostia and turbinates and septum.  Overall, signs of mucosal inflammation are noted. Also noted are bilateral post-surgical changes with some crusting and clot within bilateral middle meati. These were debrided with a 8 Fr suction straight and curved. Small pieces of free-floating bone were removed. Some posi-sep packing also removed bilateral MM. After debridement, sinus cavity patency was much improved. No adverse synechiae are noted. No evidence of septal hematoma,, turbinates reduced Right Middle meatus: improved patency after debridement Right SE Recess: patent Left MM: improved patency after debridement Left SE Recess: patent Photodocumentation was obtained.    CPT CODE -- 40102 - Mod 79, 50      Impression & Plans:  Kevan Prouty is a 48 y.o. male with:  1. Other chronic sinusitis   2. Nasal obstruction   3. Nasal congestion   4. Nasal septal deviation   5. Hypertrophy of both inferior nasal turbinates    We discussed the multifactorial (likely) etiology of his sx - likely allergies  playing a role in mucosal edema, and then predisposition to infections as a result. He also has nasal obstruction with corresponding septal deviation which is a structural component.  He also continues to have frequent exacerbations of his sinus infections requiring ~5 courses of antibiotics per year with persistent disease on post-treatment CT after maximal medication management. This has not changed compared to MRI in Jan 2024.   Now s/p septo/turbs and bilateral FESS 10/2023. Recovering appropriately. We discussed restarting his medications and good post-op nasal regimen adherence.  Restart ryaltris 665-25 mcg two sprays each nostril twice per day and zyrtec PO daily Start daily NeilMed Sinus rinses Appreciate Dr. Elmyra Ricks continued help in managing the allergic component  - f/u 2 weeks  See below regarding exact medications prescribed this encounter including dosages and route: Restart ryaltris spray 665-25 mcg two sprays each nostril twice per day     Thank you for allowing me the opportunity to care for your patient. Please do not hesitate to contact me should you have any other questions.  Sincerely, Jovita Kussmaul, MD Otolaryngologist (ENT), [facility] Phone: [phone] Fax: [phone]  11/11/2023, 4:36 PM   MDM:  Level 4 - 99214 (for FESS, not septo/turbs) Complexity/Problems addressed: mod - multiple chronic problems Data complexity: low - review of path - Morbidity: mod  - Prescription Drug prescribed or managed: yes - restart sprays

## 2023-11-11 NOTE — Patient Instructions (Addendum)
 Can gently blow nose on Monday Can start CPAP on Monday Restart Gym a week from Monday Restart ryaltris and zyrtec Start Lloyd Huger Med Nasal Saline Rinse - twice daily - start nasal saline rinses with NeilMed Bottle available over the counter    Nasal Saline Irrigation instructions: If you choose to make your own salt water solution, You will need: Salt (kosher, canning, or pickling salt) Baking soda Nasal irrigation bottle (i.e. Lloyd Huger Med Sinus Rinse) Measuring spoon ( teaspoon) Distilled / boiled water   Mix solution Mix 1 teaspoon of salt, 1/2 teaspoon of baking soda and 1 cup of water into irrigation bottle ** May use saline packet instead of homemade recipe for this step if you prefer If medicine was prescribed to be mixed with solution, place this into bottle Examples 2 inches of 2% mupirocin ointment Budesonide solution Position your head: Lean over sink (about 45 degrees) Rotate head (about 45 degrees) so that one nostril is above the other Irrigate Insert tip of irrigation bottle into upper nostril so it forms a comfortable seal Irrigate while breathing through your mouth May remove the straw from the bottle in order to irrigate the entire solution (important if medicine was added) Exhale through nose when finished and blow nose as necessary  Repeat on opposite side with other 1/2 of solution (120 mL) or remake solution if all 240 mL was used on first side Wash irrigation bottle regularly, replace every 3 months

## 2023-11-13 ENCOUNTER — Encounter: Payer: Self-pay | Admitting: Emergency Medicine

## 2023-11-13 ENCOUNTER — Other Ambulatory Visit: Payer: Self-pay

## 2023-11-13 ENCOUNTER — Ambulatory Visit
Admission: EM | Admit: 2023-11-13 | Discharge: 2023-11-13 | Disposition: A | Discharge status: Home / Self Care | Attending: Family Medicine | Admitting: Family Medicine

## 2023-11-13 DIAGNOSIS — K1379 Other lesions of oral mucosa: Secondary | ICD-10-CM | POA: Diagnosis not present

## 2023-11-13 MED ORDER — PREDNISONE 20 MG PO TABS
ORAL_TABLET | ORAL | 0 refills | Status: DC
Start: 2023-11-13 — End: 2023-12-14

## 2023-11-13 NOTE — ED Provider Notes (Signed)
 [facility]    CSN: [id number] Arrival date & time: 11/13/23  1255      History   Chief Complaint Chief Complaint  Patient presents with   Sore Throat    HPI Kyle Duncan is a 48 y.o. male.   HPI 48 year old male presents with inflamed hard palate of mouth for 1 week with sore throat.  Patient reports having septoplasty done last Monday, 11/07/2022.  PMH significant for morbid obesity, PSVT, and sleep apnea.  Past Medical History:  Diagnosis Date   H/O: vasectomy 06/09/2016   01/2013   PSVT (paroxysmal supraventricular tachycardia) (HCC)    Recurrent upper respiratory infection (URI)    Sleep apnea     Patient Active Problem List   Diagnosis Date Noted   Nasal septal deviation 11/07/2023   Nasal obstruction 11/07/2023   Recurrent maxillary sinusitis 11/07/2023   Recurrent ethmoidal sinusitis 11/07/2023   Hypertrophy of both inferior nasal turbinates 11/07/2023   BMI 40.0-44.9, adult (HCC) 06/10/2023   Chronic pain of left ankle 09/01/2021   Acute irritant rhinitis 06/02/2021   Right lumbar radiculopathy 05/05/2021   Biceps tendinitis, left, distal 05/05/2021   Abnormal weight gain 01/13/2021   De Quervain's tenosynovitis, right 12/01/2020   Erectile dysfunction 12/01/2020   Cubital tunnel syndrome of both upper extremities 09/07/2017   Tennis elbow 09/07/2017   H/O: vasectomy 06/09/2016   Decreased testosterone level 06/03/2016   History of PSVT (paroxysmal supraventricular tachycardia) 05/31/2016   Bradycardia 05/31/2016   Fatigue 05/31/2016   Decreased libido 05/31/2016   Weight gain 05/31/2016    Past Surgical History:  Procedure Laterality Date   CARDIAC CAT[facility]TERIZ[facility]     [facility]   [facility]OSCOPY     SEPTOPLASTY WITH ETHMOIDECTOMY, AND MAXILLARY ANTROSTOMY Bilateral 11/07/2023   Procedure: FESS, WITH MAXILLARY ANTROSTOMY, SEPTOPLASTY, AND ETHMOIDECTOMY;  Surgeon: Read Drivers, MD;  Location: [facility];   Service: ENT;  Laterality: Bilateral;   SINUS ENDO WITH FUSION N/A 11/07/2023   Procedure: SURGERY, PARANASAL SINUS, ENDOSCOPIC, WITH NASAL SEPTOPLASTY, TURBINOPLASTY, AND MAXILLARY SINUSOTOMY;  Surgeon: Read Drivers, MD;  Location: [facility];  Service: ENT;  Laterality: N/A;   TURBINATE REDUCTION Bilateral 11/07/2023   Procedure: REDUCTION, NASAL TURBINATE;  Surgeon: Read Drivers, MD;  Location: [facility];  Service: ENT;  Laterality: Bilateral;       Home Medications    Prior to Admission medications   Medication Sig Start Date End Date Taking? Authorizing Provider  predniSONE (DELTASONE) 20 MG tablet Take 3 tabs PO daily x 5 days. 11/13/23  Yes Trevor Iha, FNP  acetaminophen (TYLENOL) 500 MG tablet Take 2 tablets (1,000 mg total) by mouth every 6 (six) hours as needed. 11/07/23   Read Drivers, MD  AMBULATORY NON FORMULARY MEDICATION Continuous positive airway pressure (CPAP) machine set on AutoPAP (10-20 cmH2O), with all supplemental supplies as needed. Please provide a large size [employer]kel Full Face Simplus Mask. 06/02/22   Christen Butter, NP  calcium-vitamin D (OSCAL WITH D) 500-5 MG-MCG tablet Take 1 tablet by mouth.    [provider]  cyanocobalamin (VITAMIN B12) 500 MCG tablet Take 500 mcg by mouth daily.    [provider]  ibuprofen (ADVIL) 200 MG tablet Take 2 tablets (400 mg total) by mouth every 6 (six) hours as needed. 11/07/23   Read Drivers, MD  MAGNESIUM HYDROXIDE PO Take by mouth.    [provider]  Menaquinone-7 (K2 PO) Take by  mouth.    [provider]  sodium chloride (OCEAN) 0.65 % SOLN nasal spray Place 2 sprays into both nostrils as needed for up to 14 days for congestion. 11/07/23 11/21/23  Read Drivers, MD  tadalafil (CIALIS) 10 MG tablet Take 1 tablet (10 mg total) by mouth daily. 06/10/23   Christen Butter, NP  Zinc Acetate, Oral, (ZINC ACETATE PO) Take by mouth.    [provider]    Family History Family History  Problem Relation Age of Onset   Hypertension Mother    Diabetes Mother    Cancer Father    Colon cancer Maternal Aunt    Stroke Paternal Uncle    Diabetes Maternal Grandmother    Hyperlipidemia Maternal Grandmother    Hypertension Maternal Grandmother    Breast cancer Maternal Grandmother    Hypertension Maternal Grandfather    Heart disease Maternal Grandfather    Skin cancer Maternal Grandfather    Heart attack Paternal Grandmother    Stroke Paternal Grandfather    Allergic rhinitis Neg Hx    Asthma Neg Hx    Eczema Neg Hx    Urticaria Neg Hx     Social History Social History   Tobacco Use   Smoking status: Former    Types: Cigarettes    Passive exposure: Past   Smokeless tobacco: Never  Vaping Use   Vaping status: Never Used  Substance Use Topics   Alcohol use: Yes    Comment: socially   Drug use: Never     Allergies   Dust mite mixed allergen ext [mite (d. farinae)]   Review of Systems Review of Systems  HENT:  Positive for sore throat.        Inflamed painful post oropharyngeal area x 1 week  All other systems reviewed and are negative.    Physical Exam Triage Vital Signs ED Triage Vitals  Encounter Vitals Group     BP      Systolic BP Percentile      Diastolic BP Percentile      Pulse      Resp      Temp      Temp src      SpO2      Weight      Height      Head Circumference      Peak Flow      Pain Score      Pain Loc      Pain Education      Exclude from Growth Chart    No data found.  Updated Vital Signs BP 131/76 (BP Location: Right Arm)   Pulse 88   Temp 98 F (36.7 C) (Oral)   SpO2 99%      Physical Exam Vitals and nursing note reviewed.  Constitutional:      Appearance: Normal appearance. He is normal weight. He is ill-appearing.  HENT:     Head: Normocephalic and atraumatic.     Mouth/Throat:     Mouth: Mucous membranes are moist.     Pharynx: Oropharynx is clear.  Eyes:      Extraocular Movements: Extraocular movements intact.     Conjunctiva/sclera: Conjunctivae normal.     Pupils: Pupils are equal, round, and reactive to light.     Comments: Posterior oropharynx: Erythematous, inflamed  Cardiovascular:     Rate and Rhythm: Normal rate and regular rhythm.     Pulses: Normal pulses.     Heart sounds: Normal heart sounds.  Pulmonary:     Effort: Pulmonary effort is normal.     Breath sounds: Normal breath sounds. No wheezing, rhonchi or rales.  Musculoskeletal:        General: Normal range of motion.     Cervical back: Normal range of motion and neck supple.  Skin:    General: Skin is warm and dry.  Neurological:     General: No focal deficit present.     Mental Status: He is alert and oriented to person, place, and time. Mental status is at baseline.  Psychiatric:        Mood and Affect: Mood normal.      UC Treatments / Results  Labs (all labs ordered are listed, but only abnormal results are displayed) Labs Reviewed - No data to display  EKG   Radiology No results found.  Procedures Procedures (including critical care time)  Medications Ordered in UC Medications - No data to display  Initial Impression / Assessment and Plan / UC Course  I have reviewed the triage vital signs and the nursing notes.  Pertinent labs & imaging results that were available during my care of the patient were reviewed by me and considered in my medical decision making (see chart for details).     MDM: 1.  Pain in oral cavity-Rx'd prednisone 20 mg tablet: Take 3 tabs p.o. daily x 5 days. Advised patient to take medication as directed with food to completion.  Encouraged to increase daily water intake to 64 ounces per day while taking this medication.  Advised if symptoms worsen and/or unresolved please follow-up with your ENT or here for further evaluation.  Patient discharged home, hemodynamically stable. Final Clinical Impressions(s) / UC Diagnoses    Final diagnoses:  Pain in oral cavity     Discharge Instructions      Advised patient to take medication as directed with food to completion.  Encouraged to increase daily water intake to 64 ounces per day while taking this medication.  Advised if symptoms worsen and/or unresolved please follow-up with your ENT or here for further evaluation.     ED Prescriptions     Medication Sig Dispense Auth. Provider   predniSONE (DELTASONE) 20 MG tablet Take 3 tabs PO daily x 5 days. 15 tablet Trevor Iha, FNP      PDMP not reviewed this encounter.   Trevor Iha, FNP 11/13/23 1425

## 2023-11-13 NOTE — Discharge Instructions (Addendum)
 Advised patient to take medication as directed with food to completion.  Encouraged to increase daily water intake to 64 ounces per day while taking this medication.  Advised if symptoms worsen and/or unresolved please follow-up with your ENT or here for further evaluation.

## 2023-11-13 NOTE — ED Triage Notes (Signed)
 Pt states he had a septoplasty done last Monday. He had follow up on Friday and was told the pain and redness should resolve over the weekend. He states the pain is worse today and he can not eat or drink. Taking abx and ibuprofen and using chloraseptic spray with no relief.

## 2023-11-15 ENCOUNTER — Telehealth (INDEPENDENT_AMBULATORY_CARE_PROVIDER_SITE_OTHER): Payer: Self-pay

## 2023-11-15 MED ORDER — TRAMADOL HCL 50 MG PO TABS: 50.0000 mg | ORAL_TABLET | Freq: Four times a day (QID) | ORAL | 0 refills | Status: AC | PRN

## 2023-11-15 NOTE — Telephone Encounter (Signed)
 See telephone note

## 2023-11-17 ENCOUNTER — Telehealth (INDEPENDENT_AMBULATORY_CARE_PROVIDER_SITE_OTHER): Payer: Self-pay | Admitting: Otolaryngology

## 2023-11-17 NOTE — Addendum Note (Signed)
 Addended by: Jovita Kussmaul on: 11/17/2023 07:59 AM   Modules accepted: Level of Service

## 2023-11-17 NOTE — Telephone Encounter (Signed)
 Left vm to confirm appt date and location for 11/18/2023.

## 2023-11-18 ENCOUNTER — Ambulatory Visit (INDEPENDENT_AMBULATORY_CARE_PROVIDER_SITE_OTHER): Admitting: Otolaryngology

## 2023-11-18 ENCOUNTER — Encounter (INDEPENDENT_AMBULATORY_CARE_PROVIDER_SITE_OTHER): Payer: Self-pay

## 2023-11-18 VITALS — BP 149/90 | HR 60 | Ht 69.0 in | Wt 305.0 lb

## 2023-11-18 DIAGNOSIS — J328 Other chronic sinusitis: Secondary | ICD-10-CM

## 2023-11-18 DIAGNOSIS — J3489 Other specified disorders of nose and nasal sinuses: Secondary | ICD-10-CM

## 2023-11-18 DIAGNOSIS — Z9889 Other specified postprocedural states: Secondary | ICD-10-CM

## 2023-11-18 DIAGNOSIS — J343 Hypertrophy of nasal turbinates: Secondary | ICD-10-CM

## 2023-11-18 DIAGNOSIS — R0981 Nasal congestion: Secondary | ICD-10-CM

## 2023-11-18 DIAGNOSIS — J342 Deviated nasal septum: Secondary | ICD-10-CM

## 2023-11-25 ENCOUNTER — Encounter (INDEPENDENT_AMBULATORY_CARE_PROVIDER_SITE_OTHER): Payer: Self-pay

## 2023-11-25 ENCOUNTER — Ambulatory Visit (INDEPENDENT_AMBULATORY_CARE_PROVIDER_SITE_OTHER): Admitting: Otolaryngology

## 2023-11-25 VITALS — BP 144/77 | HR 60 | Ht 69.0 in | Wt 305.0 lb

## 2023-11-25 DIAGNOSIS — J342 Deviated nasal septum: Secondary | ICD-10-CM

## 2023-11-25 DIAGNOSIS — J3489 Other specified disorders of nose and nasal sinuses: Secondary | ICD-10-CM | POA: Diagnosis not present

## 2023-11-25 DIAGNOSIS — J328 Other chronic sinusitis: Secondary | ICD-10-CM

## 2023-11-25 DIAGNOSIS — R0981 Nasal congestion: Secondary | ICD-10-CM

## 2023-11-25 DIAGNOSIS — J343 Hypertrophy of nasal turbinates: Secondary | ICD-10-CM

## 2023-11-25 DIAGNOSIS — Z9889 Other specified postprocedural states: Secondary | ICD-10-CM

## 2023-11-25 MED ORDER — PREDNISOLONE ACETATE 1 % OP SUSP
OPHTHALMIC | 6 refills | Status: DC
Start: 2023-11-25 — End: 2023-12-14

## 2023-11-25 NOTE — Progress Notes (Signed)
 Dear Dr. Larinda Buttery, Here is my assessment for our mutual patient, Kyle Duncan. Thank you for allowing me the opportunity to care for your patient. Please do not hesitate to contact me should you have any other questions. Sincerely, Dr. Jovita Kussmaul  Otolaryngology Clinic Note Referring provider: Dr. Larinda Buttery HPI:  Kyle Duncan is a 48 y.o. male kindly referred by Dr. Larinda Buttery for evaluation of chronic sinusitis and allergic rhinitis  Initial visit (07/2023): Patient reports: he reports that he has has had sinus issues for several years and allergies. He saw Dr. Selena Batten in 06/28/2023 for allergies. He reports that every couple of months, he is getting some kind of sinus infection and cold. He starts to have PND, maxillary pressure, nasal stuffiness/congestion and some pressure behind the eyes. It turns into a sinus infection -- then he will have headache, significant congestion, mostly mucoid drainage but intermittent discoloration, maxillary/frontal and ethmoid pressure (mostly max and ethmoid). Sense of smell is not great. He normally does get antibiotics for it (5-6 courses per year). Will also get steroids last 2-3 visits and both antibiotics and steroids help significantly. Allergy symptoms are "all the time" - baseline congested - both sides. Has had allergy testing (2024) - grass, ragweed, weed, trees, dust mites No frequent PNA or ear infections  Of note, he does think he has a deviated septum.  He is using Ryaltris, Zyrtec. He does think it helps some. He has not used saline rinses. Sudafed does not work, mucinex does not work. Has not used singulair. Advil cold/sinus works. He has not used afrin.  Does take supplements  No CT sinus.   Has GERD, pepcid. --------------------------------------------------------- He was treated medically maximally and now returns after post-treatment CT.  09/21/2023 He reports that he is doing ok from sinus standpoint; no discolored drainage, feels like  things have cleared some. No significant pressure. 5-6 infections/year overall still. He is using ryaltris. Antibiotics and steroids helped. Complains of left nasal obstruction. We did discuss his CT. --------------------------------------------------------- 11/11/2023 Seen in POV after septo/turbs and bilateral FESS. Splints removed, some bleeding but reports after removal that he is breathing much better. Finished abx/steroids. --------------------------------------------------------- 11/18/2023 He was scheduled for a 1 month f/u but seen earlier given he was having oral cavity cavity over the uvula due to ETT rubbing. He reports that he got abx/steroids for this and he is doing better from that standpoint. Today, he does report that his left nasal cavity feels quite stuffed up and he cannot breathe out of it. No pressure in the face --------------------------------------------------------- 11/25/2023 Doing some better, still with some left sided foreign body sensation/crusting. Otherwise doing ok without any pressure in face. He is rinsing twice and using ryaltris. -----------------------------------------------------  PMHx: Allergies, GERD, PSVT (history), OSA ON CPAP  H&N Surgery: no Personal or FHx of bleeding dz or anesthesia difficulty: no  GLP-1: no AP/AC: no  Tobacco: prior smoker (quit 8 years ago). Alcohol: no. Occupation: Building control surveyor. Lives in [place], [place]  Independent Review of Additional Tests or Records:  Labs (07/01/2023):  Eos 0 IgG, A, M: negative; Complement: normal Strep pneumo titers: low Skin testing 03/29/2023 independently reviewed: + grass, ragweed, weed, trees, dust mites Dr. Elmyra Ricks office notes (Allergy) - 06/28/2023: constant sinus infection. Ryaltris, zyrtec; freq sinus infxn req abx/steroids. Did not try netipot or singulair; uses CPAP. Skin testing + to grass, ragweed, weed, trees, dust mites. Rec; Start singulair, ryaltris, allergy  shots and ref to ENT; rec immune workup PCP note  Dr. Yetta Barre 05/23/2023: No significant changes, continuing CPAP ED 06/24/2023: nasal drainage, watery eyes; Dx sinusitis; Rx augmentin and steroids and PO antihistamine ED 03/02/2023: Congestion, URI; Rx flonase  MRI Brain 09/11/22 rev independently: chronic b/l L > R sinusitis with sinuous septum; most prominent disease along bilateral max, but some ethmoid, sphenoid and frontal MPT as well  CT Sinus 08/23/2023 independently reviewed and interpreted: left caudal septal deviation, right posterior; bilateral inferior turbinate hypertrophy; trace thickening left frontal and mild b/l ethmoids; bilateral maxillary sinus opacification along floors with cyss, narrow OMC; maxillary disease has not resolved as compared to MRI  Path 11/07/2023: chronic inflammation, no malignancy   PMH/Meds/All/SocHx/FamHx/ROS:   Past Medical History:  Diagnosis Date   H/O: vasectomy 06/09/2016   01/2013   PSVT (paroxysmal supraventricular tachycardia) (HCC)    Recurrent upper respiratory infection (URI)    Sleep apnea      Past Surgical History:  Procedure Laterality Date   CARDIAC CATHETERIZATION     [facility]   [facility]ONOSCOPY     SEPTOPLASTY WITH ETHMOIDECTOMY, AND MAXILLARY ANTROSTOMY Bilateral 11/07/2023   Procedure: FESS, WITH MAXILLARY ANTROSTOMY, SEPTOPLASTY, AND ETHMOIDECTOMY;  Surgeon: Read Drivers, MD;  Location: [facility];  Service: ENT;  Laterality: Bilateral;   SINUS ENDO WITH FUSION N/A 11/07/2023   Procedure: SURGERY, PARANASAL SINUS, ENDOSCOPIC, WITH NASAL SEPTOPLASTY, TURBINOPLASTY, AND MAXILLARY SINUSOTOMY;  Surgeon: Read Drivers, MD;  Location: [facility];  Service: ENT;  Laterality: N/A;   TURBINATE REDUCTION Bilateral 11/07/2023   Procedure: REDUCTION, NASAL TURBINATE;  Surgeon: Read Drivers, MD;  Location: [facility];  Service: ENT;  Laterality: Bilateral;    Family History   Problem Relation Age of Onset   Hypertension Mother    Diabetes Mother    Cancer Father    Colon cancer Maternal Aunt    Stroke Paternal Uncle    Diabetes Maternal Grandmother    Hyperlipidemia Maternal Grandmother    Hypertension Maternal Grandmother    Breast cancer Maternal Grandmother    Hypertension Maternal Grandfather    Heart disease Maternal Grandfather    Skin cancer Maternal Grandfather    Heart attack Paternal Grandmother    Stroke Paternal Grandfather    Allergic rhinitis Neg Hx    Asthma Neg Hx    Eczema Neg Hx    Urticaria Neg Hx      Social Connections: Unknown (02/23/2023)   Received from [facility], [facility]   Social Network    Social Network: Not on file      Current Outpatient Medications:    acetaminophen (TYLENOL) 500 MG tablet, Take 2 tablets (1,000 mg total) by mouth every 6 (six) hours as needed., Disp: 30 tablet, Rfl: 0   AMBULATORY NON FORMULARY MEDICATION, Continuous positive airway pressure (CPAP) machine set on AutoPAP (10-20 cmH2O), with all supplemental supplies as needed. Please provide a large size [employer] Full Face Simplus Mask., Disp: 1 each, Rfl: 0   calcium-vitamin D (OSCAL WITH D) 500-5 MG-MCG tablet, Take 1 tablet by mouth., Disp: , Rfl:    cyanocobalamin (VITAMIN B12) 500 MCG tablet, Take 500 mcg by mouth daily., Disp: , Rfl:    ibuprofen (ADVIL) 200 MG tablet, Take 2 tablets (400 mg total) by mouth every 6 (six) hours as needed., Disp: 30 tablet, Rfl: 0   MAGNESIUM HYDROXIDE PO, Take by mouth., Disp: , Rfl:    Menaquinone-7 (K2 PO), Take by mouth., Disp: , Rfl:  prednisoLONE acetate (PRED FORTE) 1 % ophthalmic suspension, Put 6 drops in rinse bottle when you rinse, and flush half on each side of nose., Disp: 15 mL, Rfl: 6   predniSONE (DELTASONE) 20 MG tablet, Take 3 tabs PO daily x 5 days., Disp: 15 tablet, Rfl: 0   tadalafil (CIALIS) 10 MG tablet, Take 1 tablet (10 mg total) by mouth daily., Disp: 20 tablet, Rfl: 5    Zinc Acetate, Oral, (ZINC ACETATE PO), Take by mouth., Disp: , Rfl:    sodium chloride (OCEAN) 0.65 % SOLN nasal spray, Place 2 sprays into both nostrils as needed for up to 14 days for congestion., Disp: 30 mL, Rfl: 0   Physical Exam:   BP (!) 144/77 (BP Location: Left Arm, Patient Position: Sitting, Cuff Size: Large)   Pulse 60   Ht 5\' 9"  (1.753 m)   Wt (!) 305 lb (138.3 kg)   SpO2 96%   BMI 45.04 kg/m   Salient findings:  CN II-XII intact  Bilateral EAC clear and TM intact with well pneumatized middle ear spaces Anterior rhinoscopy: Septum more midline and bilateral IT reduced but significant crusting left nasal cavity; see rhino No lesions of oral cavity/oropharynx; dentition good No respiratory distress or stridor  Seprately Identifiable Procedures:  PROCEDURE: Bilateral Diagnostic Rigid Nasal Endoscopy with Bilateral Debridement Pre-procedure diagnosis: Left nasal obstruction, Post-operative examination and care after bilateral Functional Endoscopic Sinus Surgery - of note: this procedure was NOT performed for management of the septoplasty or the turbinate reduction. Post-procedure diagnosis: same Indication: See pre-procedure diagnosis and physical exam above Complications: None apparent EBL: <5 mL Anesthesia: Lidocaine 4% and topical decongestant was topically sprayed in each nasal cavity  Description of Procedure:  Patient was identified. A rigid 30 degree endoscope was utilized to evaluate the sinonasal cavities, mucosa, sinus ostia and turbinates and septum.  Overall, signs of mucosal inflammation are noted. Also noted are bilateral post-surgical changes with some crusting and clot within bilateral middle meati - left is much worse than right. There is some polypoid edema in bilateral maxillary sinuses. These were debrided with a 8 Fr suction straight and curved. After debridement, sinus cavity patency was much improved. No adverse synechiae are noted.  Right Middle meatus:  improved patency after debridement Right SE Recess: patent Left MM: improved patency after debridement Left SE Recess: patent     Photodocumentation was obtained.  CPT CODE -- 16109 - Mod 79, 50      Impression & Plans:  Kyle Duncan is a 48 y.o. male with:  1. Nasal obstruction   2. Nasal congestion   3. Nasal septal deviation   4. Other chronic sinusitis   5. S/P FESS (functional endoscopic sinus surgery)   6. Hypertrophy of both inferior nasal turbinates     We discussed the multifactorial (likely) etiology of his sx - likely allergies playing a role in mucosal edema, and then predisposition to infections as a result. He also has nasal obstruction with corresponding septal deviation which is a structural component.  He also continues to have frequent exacerbations of his sinus infections requiring ~5 courses of antibiotics per year with persistent disease on post-treatment CT after maximal medication management. This has not changed compared to MRI in Jan 2024.   Now s/p septo/turbs and bilateral FESS 10/2023. Crusting debrided today again. Given how much this is bothersome for him and some polypoid edema in max, we will add pred-forte drops to his rinses.  Continue ryaltris 665-25 mcg two sprays  each nostril twice per day and zyrtec PO daily Continue daily NeilMed Sinus rinses - add 6 drops pred forte to each rinse Appreciate Dr. Elmyra Ricks continued help in managing the allergic component   Thank you for allowing me the opportunity to care for your patient. Please do not hesitate to contact me should you have any other questions.  Sincerely, Jovita Kussmaul, MD Otolaryngologist (ENT), [facility] Phone: [phone] Fax: [phone]  11/25/2023, 1:46 PM   MDM:  [phone] - post op Complexity/Problems addressed: mod - multiple chronic problems Data complexity: low - Morbidity: low - Prescription Drug prescribed or managed: no

## 2023-11-25 NOTE — Patient Instructions (Signed)
 Continue what you are doing Put pred forte drops (6 drops in each rinse bottle) and flush half on each side.

## 2023-11-25 NOTE — Progress Notes (Signed)
 Dear Dr. Larinda Buttery, Here is my assessment for our mutual patient, Kyle Duncan. Thank you for allowing me the opportunity to care for your patient. Please do not hesitate to contact me should you have any other questions. Sincerely, Dr. Jovita Kussmaul  Otolaryngology Clinic Note Referring provider: Dr. Larinda Buttery HPI:  Kyle Duncan is a 48 y.o. male kindly referred by Dr. Larinda Buttery for evaluation of chronic sinusitis and allergic rhinitis  Initial visit (07/2023): Patient reports: he reports that he has has had sinus issues for several years and allergies. He saw Dr. Selena Batten in 06/28/2023 for allergies. He reports that every couple of months, he is getting some kind of sinus infection and cold. He starts to have PND, maxillary pressure, nasal stuffiness/congestion and some pressure behind the eyes. It turns into a sinus infection -- then he will have headache, significant congestion, mostly mucoid drainage but intermittent discoloration, maxillary/frontal and ethmoid pressure (mostly max and ethmoid). Sense of smell is not great. He normally does get antibiotics for it (5-6 courses per year). Will also get steroids last 2-3 visits and both antibiotics and steroids help significantly. Allergy symptoms are "all the time" - baseline congested - both sides. Has had allergy testing (2024) - grass, ragweed, weed, trees, dust mites No frequent PNA or ear infections  Of note, he does think he has a deviated septum.  He is using Ryaltris, Zyrtec. He does think it helps some. He has not used saline rinses. Sudafed does not work, mucinex does not work. Has not used singulair. Advil cold/sinus works. He has not used afrin.  Does take supplements  No CT sinus.   Has GERD, pepcid. --------------------------------------------------------- He was treated medically maximally and now returns after post-treatment CT.  09/21/2023 He reports that he is doing ok from sinus standpoint; no discolored drainage, feels like  things have cleared some. No significant pressure. 5-6 infections/year overall still. He is using ryaltris. Antibiotics and steroids helped. Complains of left nasal obstruction. We did discuss his CT. --------------------------------------------------------- 11/11/2023 Seen in POV after septo/turbs and bilateral FESS. Splints removed, some bleeding but reports after removal that he is breathing much better. Finished abx/steroids. --------------------------------------------------------- 11/18/2023 He was scheduled for a 1 month f/u but seen earlier given he was having oral cavity cavity over the uvula due to ETT rubbing. He reports that he got abx/steroids for this and he is doing better from that standpoint. Today, he does report that his left nasal cavity feels quite stuffed up and he cannot breathe out of it. No pressure in the face  PMHx: Allergies, GERD, PSVT (history), OSA ON CPAP  H&N Surgery: no Personal or FHx of bleeding dz or anesthesia difficulty: no  GLP-1: no AP/AC: no  Tobacco: prior smoker (quit 8 years ago). Alcohol: no. Occupation: Building control surveyor. Lives in [place], [place]  Independent Review of Additional Tests or Records:  Labs (07/01/2023):  Eos 0 IgG, A, M: negative; Complement: normal Strep pneumo titers: low Skin testing 03/29/2023 independently reviewed: + grass, ragweed, weed, trees, dust mites Dr. Elmyra Ricks office notes (Allergy) - 06/28/2023: constant sinus infection. Ryaltris, zyrtec; freq sinus infxn req abx/steroids. Did not try netipot or singulair; uses CPAP. Skin testing + to grass, ragweed, weed, trees, dust mites. Rec; Start singulair, ryaltris, allergy shots and ref to ENT; rec immune workup PCP note Dr. Yetta Barre 05/23/2023: No significant changes, continuing CPAP ED 06/24/2023: nasal drainage, watery eyes; Dx sinusitis; Rx augmentin and steroids and PO antihistamine ED 03/02/2023: Congestion, URI; Rx flonase  MRI Brain 09/11/22 rev  independently: chronic b/l L > R sinusitis with sinuous septum; most prominent disease along bilateral max, but some ethmoid, sphenoid and frontal MPT as well  CT Sinus 08/23/2023 independently reviewed and interpreted: left caudal septal deviation, right posterior; bilateral inferior turbinate hypertrophy; trace thickening left frontal and mild b/l ethmoids; bilateral maxillary sinus opacification along floors with cyss, narrow OMC; maxillary disease has not resolved as compared to MRI  Path 11/07/2023: chronic inflammation, no malignancy   PMH/Meds/All/SocHx/FamHx/ROS:   Past Medical History:  Diagnosis Date   H/O: vasectomy 06/09/2016   01/2013   PSVT (paroxysmal supraventricular tachycardia) (HCC)    Recurrent upper respiratory infection (URI)    Sleep apnea      Past Surgical History:  Procedure Laterality Date   CARDIAC CAT[facility]TERIZ[facility]     [facility]   [facility]OS[facility]     SEPTOPLASTY WITH ETHMOIDECTOMY, AND MAXILLARY ANTROSTOMY Bilateral 11/07/2023   Procedure: FESS, WITH MAXILLARY ANTROSTOMY, SEPTOPLASTY, AND ETHMOIDECTOMY;  Surgeon: Read Drivers, MD;  Location: [facility];  Service: ENT;  Laterality: Bilateral;   SINUS ENDO WITH FUSION N/A 11/07/2023   Procedure: SURGERY, PARANASAL SINUS, ENDOSCOPIC, WITH NASAL SEPTOPLASTY, TURBINOPLASTY, AND MAXILLARY SINUSOTOMY;  Surgeon: Read Drivers, MD;  Location: [facility];  Service: ENT;  Laterality: N/A;   TURBINATE REDUCTION Bilateral 11/07/2023   Procedure: REDUCTION, NASAL TURBINATE;  Surgeon: Read Drivers, MD;  Location:  SURGERY CENTER;  Service: ENT;  Laterality: Bilateral;    Family History  Problem Relation Age of Onset   Hypertension Mother    Diabetes Mother    Cancer Father    Colon cancer Maternal Aunt    Stroke Paternal Uncle    Diabetes Maternal Grandmother    Hyperlipidemia Maternal Grandmother    Hypertension Maternal Grandmother    Breast cancer Maternal  Grandmother    Hypertension Maternal Grandfather    Heart disease Maternal Grandfather    Skin cancer Maternal Grandfather    Heart attack Paternal Grandmother    Stroke Paternal Grandfather    Allergic rhinitis Neg Hx    Asthma Neg Hx    Eczema Neg Hx    Urticaria Neg Hx      Social Connections: Unknown (02/23/2023)   Received from [facility]   Social Network    Social Network: Not on file      Current Outpatient Medications:    acetaminophen (TYLENOL) 500 MG tablet, Take 2 tablets (1,000 mg total) by mouth every 6 (six) hours as needed., Disp: 30 tablet, Rfl: 0   AMBULATORY NON FORMULARY MEDICATION, Continuous positive airway pressure (CPAP) machine set on AutoPAP (10-20 cmH2O), with all supplemental supplies as needed. Please provide a large size [employer] Full Face Simplus Mask., Disp: 1 each, Rfl: 0   calcium-vitamin D (OSCAL WITH D) 500-5 MG-MCG tablet, Take 1 tablet by mouth., Disp: , Rfl:    cyanocobalamin (VITAMIN B12) 500 MCG tablet, Take 500 mcg by mouth daily., Disp: , Rfl:    ibuprofen (ADVIL) 200 MG tablet, Take 2 tablets (400 mg total) by mouth every 6 (six) hours as needed., Disp: 30 tablet, Rfl: 0   MAGNESIUM HYDROXIDE PO, Take by mouth., Disp: , Rfl:    Menaquinone-7 (K2 PO), Take by mouth., Disp: , Rfl:    sodium chloride (OCEAN) 0.65 % SOLN nasal spray, Place 2 sprays into both nostrils as needed for up to 14 days for congestion., Disp: 30 mL, Rfl: 0  tadalafil (CIALIS) 10 MG tablet, Take 1 tablet (10 mg total) by mouth daily., Disp: 20 tablet, Rfl: 5   Zinc Acetate, Oral, (ZINC ACETATE PO), Take by mouth., Disp: , Rfl:    prednisoLONE acetate (PRED FORTE) 1 % ophthalmic suspension, Put 6 drops in rinse bottle when you rinse, and flush half on each side of nose., Disp: 15 mL, Rfl: 6   predniSONE (DELTASONE) 20 MG tablet, Take 3 tabs PO daily x 5 days., Disp: 15 tablet, Rfl: 0   Physical Exam:   BP (!) 149/90 (BP Location: Left Arm, Patient  Position: Sitting, Cuff Size: Large)   Pulse 60   Ht 5\' 9"  (1.753 m)   Wt (!) 305 lb (138.3 kg)   SpO2 96%   BMI 45.04 kg/m   Salient findings:  CN II-XII intact  Bilateral EAC clear and TM intact with well pneumatized middle ear spaces Anterior rhinoscopy: Septum more midline and bilateral IT reduced but significant crusting left nasal cavity No lesions of oral cavity/oropharynx - uvula with slight swelling but is healing nicely; dentition good No respiratory distress or stridor  Seprately Identifiable Procedures:  PROCEDURE: Bilateral Diagnostic Rigid Nasal Endoscopy with Bilateral Debridement Pre-procedure diagnosis: Left nasal obstruction, Post-operative examination and care after bilateral Functional Endoscopic Sinus Surgery - of note: this procedure was NOT performed for management of the septoplasty or the turbinate reduction. Post-procedure diagnosis: same Indication: See pre-procedure diagnosis and physical exam above Complications: None apparent EBL: <5 mL Anesthesia: Lidocaine 4% and topical decongestant was topically sprayed in each nasal cavity  Description of Procedure:  Patient was identified. A rigid 30 degree endoscope was utilized to evaluate the sinonasal cavities, mucosa, sinus ostia and turbinates and septum.  Overall, signs of mucosal inflammation are noted. Also noted are bilateral post-surgical changes with some crusting and clot within bilateral middle meati - left is much worse than right. These were debrided with a 8 Fr suction straight and curved. After debridement, sinus cavity patency was much improved. No adverse synechiae are noted. No evidence of septal hematoma,, turbinates reduced Right Middle meatus: improved patency after debridement Right SE Recess: patent Left MM: improved patency after debridement Left SE Recess: patent  Photodocumentation was obtained.  CPT CODE -- 16109 - Mod 79, 50      Impression & Plans:  Reagan Klemz is a 48  y.o. male with:  1. Nasal obstruction   2. Nasal congestion   3. Nasal septal deviation   4. Other chronic sinusitis   5. Hypertrophy of both inferior nasal turbinates    We discussed the multifactorial (likely) etiology of his sx - likely allergies playing a role in mucosal edema, and then predisposition to infections as a result. He also has nasal obstruction with corresponding septal deviation which is a structural component.  He also continues to have frequent exacerbations of his sinus infections requiring ~5 courses of antibiotics per year with persistent disease on post-treatment CT after maximal medication management. This has not changed compared to MRI in Jan 2024.   Now s/p septo/turbs and bilateral FESS 10/2023. Recovering appropriately except for some uvular pain and swelling for which he got abx/steroids and recovering from that (most likely the ETT was rubbing over the area)  Crusting debrided today again. Given how much this is bothersome for him, we discussed that he has a f/u next week and he would like to keep it to see if he has additional problems with crusting.  Continue ryaltris 665-25 mcg two sprays  each nostril twice per day and zyrtec PO daily Continue daily NeilMed Sinus rinses Finish prescribed abx/steroids Appreciate Dr. Elmyra Ricks continued help in managing the allergic component   Thank you for allowing me the opportunity to care for your patient. Please do not hesitate to contact me should you have any other questions.  Sincerely, Jovita Kussmaul, MD Otolaryngologist (ENT), [facility] Phone: [phone] Fax: [phone]  11/25/2023, 1:41 PM   MDM:  [phone] - post op Complexity/Problems addressed: mod - multiple chronic problems Data complexity: low - Morbidity: low - Prescription Drug prescribed or managed: no

## 2023-12-14 ENCOUNTER — Ambulatory Visit (INDEPENDENT_AMBULATORY_CARE_PROVIDER_SITE_OTHER): Payer: Self-pay

## 2023-12-14 ENCOUNTER — Encounter: Payer: Self-pay | Admitting: Medical-Surgical

## 2023-12-14 ENCOUNTER — Ambulatory Visit: Admitting: Medical-Surgical

## 2023-12-14 VITALS — BP 129/71 | HR 52 | Resp 20 | Ht 69.0 in | Wt 312.0 lb

## 2023-12-14 DIAGNOSIS — H93A1 Pulsatile tinnitus, right ear: Secondary | ICD-10-CM | POA: Diagnosis not present

## 2023-12-14 DIAGNOSIS — Z8249 Family history of ischemic heart disease and other diseases of the circulatory system: Secondary | ICD-10-CM

## 2023-12-14 DIAGNOSIS — N529 Male erectile dysfunction, unspecified: Secondary | ICD-10-CM

## 2023-12-14 DIAGNOSIS — Z630 Problems in relationship with spouse or partner: Secondary | ICD-10-CM

## 2023-12-14 MED ORDER — TADALAFIL 20 MG PO TABS: 20.0000 mg | ORAL_TABLET | Freq: Every day | ORAL | 5 refills | Status: AC

## 2023-12-14 NOTE — Progress Notes (Unsigned)
        Established patient visit  History, exam, impression, and plan:  1. Family history of heart attack (Primary) Pleasant 48 year old male presenting today with considerable concerns regarding heart health and a family history of heart disease/heart attack.  He engages in regular intentional exercise which is often strenuous and including heavy lifting.  Has noted that his bilateral ankles and lower legs swell when sedentary which prompted some of his concern.  Denies chest pain, chest pressure, shortness of breath at rest.  Currently asymptomatic.  EKG on file from 08/2022 with no concerns identified.  No prior echocardiogram.  Discussed evaluation for CT coronary calcium scoring.  He is agreeable so order placed today.  Blood pressure is well-controlled and cardiopulmonary exam is normal.  No indication for further workup at this time. - CT CARDIAC SCORING (SELF PAY ONLY); Future  2. Erectile dysfunction, unspecified erectile dysfunction type History of erectile dysfunction and has been using tadalafil  10 mg daily as needed.  Only averages about 1 or 2 doses per month and feels that the medication works fairly well.  Is able to wake with interaction in the morning but notes that he is unable to maintain an erection in situations where he is intimate with his wife.  Able to maintain the erection when he is alone and no difficulty reaching orgasm.  Wonders if this may be psychological in nature or combination of things.  After discussion, feel that this is multifactorial due to marital stress and health concerns.  Reviewed the importance of counseling in the setting of psychological factors but he is not open to this today.  Plan to trial tadalafil  20 mg daily as needed to see if this is more effective. - tadalafil  (CIALIS ) 20 MG tablet; Take 1 tablet (20 mg total) by mouth daily.  Dispense: 20 tablet; Refill: 5  3. Pulsatile tinnitus of right ear During the appointment today he notes that he has  had some pulsatile tinnitus in the right ear has been going on for a bit.  He has seen ENT but has not mentioned this to them.  This only happens when he stands up.  Described as a whooshing noise in his ear that is similar to the beat of his heart.  Recent CT sinus imaging with no notable concerns.  Denies dizziness, lightheadedness, near-syncope, vision disturbances, unusual headaches, and hearing loss.  Overall exam is normal.  Discussed recommendations for urgent referral to ENT however he already has a provider in place.  He has an upcoming appointment so I have recommended he mention this to his ENT provider for further evaluation.  He would like to proceed with any workup that may be helpful so ordering MRA today. - MR Angiogram Head Wo Contrast; Future  4. Marital stress Reports that he is dealing with quite a bit of marital stress and that he and his wife are not on the best terms right now.  This is negatively affecting other aspects of his life.  Denies SI/HI.  Discussed recommendations for treatment with CBT as well as medication options.  Referral for counseling and medications were declined today but he will let me know if he changes his mind.  Procedures performed this visit: None.  Return if symptoms worsen or fail to improve.  Further follow-up pending imaging results.  __________________________________ Maryl Snook, DNP, APRN, FNP-BC Primary Care and Sports Medicine [facility] [place]

## 2023-12-15 ENCOUNTER — Other Ambulatory Visit (HOSPITAL_COMMUNITY): Payer: Self-pay

## 2023-12-18 ENCOUNTER — Ambulatory Visit

## 2023-12-18 DIAGNOSIS — H93A9 Pulsatile tinnitus, unspecified ear: Secondary | ICD-10-CM | POA: Diagnosis not present

## 2023-12-18 DIAGNOSIS — H93A1 Pulsatile tinnitus, right ear: Secondary | ICD-10-CM

## 2023-12-19 ENCOUNTER — Other Ambulatory Visit (HOSPITAL_COMMUNITY): Payer: Self-pay

## 2024-01-04 ENCOUNTER — Encounter (INDEPENDENT_AMBULATORY_CARE_PROVIDER_SITE_OTHER): Payer: Self-pay | Admitting: Otolaryngology

## 2024-01-04 ENCOUNTER — Ambulatory Visit (INDEPENDENT_AMBULATORY_CARE_PROVIDER_SITE_OTHER): Admitting: Otolaryngology

## 2024-01-04 VITALS — BP 124/71 | HR 53 | Ht 69.0 in | Wt 305.0 lb

## 2024-01-04 DIAGNOSIS — J343 Hypertrophy of nasal turbinates: Secondary | ICD-10-CM

## 2024-01-04 DIAGNOSIS — J328 Other chronic sinusitis: Secondary | ICD-10-CM | POA: Diagnosis not present

## 2024-01-04 DIAGNOSIS — J3489 Other specified disorders of nose and nasal sinuses: Secondary | ICD-10-CM

## 2024-01-04 DIAGNOSIS — R0981 Nasal congestion: Secondary | ICD-10-CM

## 2024-01-04 DIAGNOSIS — J342 Deviated nasal septum: Secondary | ICD-10-CM

## 2024-01-04 DIAGNOSIS — Z9889 Other specified postprocedural states: Secondary | ICD-10-CM

## 2024-01-04 NOTE — Progress Notes (Signed)
 Dear Dr. Cleora Daft, Here is my assessment for our mutual patient, Kyle Duncan. Thank you for allowing me the opportunity to care for your patient. Please do not hesitate to contact me should you have any other questions. Sincerely, Dr. Milon Aloe  Otolaryngology Clinic Note Referring provider: Dr. Cleora Daft HPI:  Kyle Duncan is a 48 y.o. male kindly referred by Dr. Cleora Daft for evaluation of chronic sinusitis and allergic rhinitis  Initial visit (07/2023): Patient reports: he reports that he has has had sinus issues for several years and allergies. He saw Dr. Burdette Carolin in 06/28/2023 for allergies. He reports that every couple of months, he is getting some kind of sinus infection and cold. He starts to have PND, maxillary pressure, nasal stuffiness/congestion and some pressure behind the eyes. It turns into a sinus infection -- then he will have headache, significant congestion, mostly mucoid drainage but intermittent discoloration, maxillary/frontal and ethmoid pressure (mostly max and ethmoid). Sense of smell is not great. He normally does get antibiotics for it (5-6 courses per year). Will also get steroids last 2-3 visits and both antibiotics and steroids help significantly. Allergy  symptoms are "all the time" - baseline congested - both sides. Has had allergy  testing (2024) - grass, ragweed, weed, trees, dust mites No frequent PNA or ear infections  Of note, he does think he has a deviated septum.  He is using Ryaltris , Zyrtec. He does think it helps some. He has not used saline rinses. Sudafed does not work, mucinex does not work. Has not used singulair . Advil  cold/sinus works. He has not used afrin.  Does take supplements  No CT sinus.   Has GERD, pepcid. --------------------------------------------------------- He was treated medically maximally and now returns after post-treatment CT.  09/21/2023 He reports that he is doing ok from sinus standpoint; no discolored drainage, feels like  things have cleared some. No significant pressure. 5-6 infections/year overall still. He is using ryaltris . Antibiotics and steroids helped. Complains of left nasal obstruction. We did discuss his CT. --------------------------------------------------------- 11/11/2023 Seen in POV after septo/turbs and bilateral FESS. Splints removed, some bleeding but reports after removal that he is breathing much better. Finished abx/steroids. --------------------------------------------------------- 11/18/2023 He was scheduled for a 1 month f/u but seen earlier given he was having oral cavity cavity over the uvula due to ETT rubbing. He reports that he got abx/steroids for this and he is doing better from that standpoint. Today, he does report that his left nasal cavity feels quite stuffed up and he cannot breathe out of it. No pressure in the face --------------------------------------------------------- 11/25/2023 Doing some better, still with some left sided foreign body sensation/crusting. Otherwise doing ok without any pressure in face. He is rinsing twice and using ryaltris . --------------------------------------------------------- 01/04/2024 Doing well now. Breathing improved from nose, no facial pressure, no sinus infections. Rinsing daily and using ryaltris . At end of day sometimes has to rinse again because he feels congested. Otherwise pleased with the result   PMHx: Allergies, GERD, PSVT (history), OSA ON CPAP  H&N Surgery: no Personal or FHx of bleeding dz or anesthesia difficulty: no  GLP-1: no AP/AC: no  Tobacco: prior smoker (quit 8 years ago). Alcohol: no. Occupation: Building control surveyor. Lives in [place], [place]  Independent Review of Additional Tests or Records:  Labs (07/01/2023):  Eos 0 IgG, A, M: negative; Complement: normal Strep pneumo titers: low Skin testing 03/29/2023 independently reviewed: + grass, ragweed, weed, trees, dust mites Dr. Marlis Simper office notes  (Allergy ) - 06/28/2023: constant sinus infection. Ryaltris , zyrtec;  freq sinus infxn req abx/steroids. Did not try netipot or singulair ; uses CPAP. Skin testing + to grass, ragweed, weed, trees, dust mites. Rec; Start singulair , ryaltris , allergy  shots and ref to ENT; rec immune workup PCP note Dr. Rochelle Chu 05/23/2023: No significant changes, continuing CPAP ED 06/24/2023: nasal drainage, watery eyes; Dx sinusitis; Rx augmentin  and steroids and PO antihistamine ED 03/02/2023: Congestion, URI; Rx flonase   MRI Brain 09/11/22 rev independently: chronic b/l L > R sinusitis with sinuous septum; most prominent disease along bilateral max, but some ethmoid, sphenoid and frontal MPT as well  CT Sinus 08/23/2023 independently reviewed and interpreted: left caudal septal deviation, right posterior; bilateral inferior turbinate hypertrophy; trace thickening left frontal and mild b/l ethmoids; bilateral maxillary sinus opacification along floors with cyss, narrow OMC; maxillary disease has not resolved as compared to MRI  Path 11/07/2023: chronic inflammation, no malignancy   PMH/Meds/All/SocHx/FamHx/ROS:   Past Medical History:  Diagnosis Date   H/O: vasectomy 06/09/2016   01/2013   PSVT (paroxysmal supraventricular tachycardia) (HCC)    Recurrent upper respiratory infection (URI)    Sleep apnea      Past Surgical History:  Procedure Laterality Date   CARDIAC CATHETERIZATION     negative cath 2012 [facility]   COLONOSCOPY     SEPTOPLASTY WITH ETHMOIDECTOMY, AND MAXILLARY ANTROSTOMY Bilateral 11/07/2023   Procedure: FESS, WITH MAXILLARY ANTROSTOMY, SEPTOPLASTY, AND ETHMOIDECTOMY;  Surgeon: Evelina Hippo, MD;  Location: [facility];  Service: ENT;  Laterality: Bilateral;   SINUS ENDO WITH FUSION N/A 11/07/2023   Procedure: SURGERY, PARANASAL SINUS, ENDOSCOPIC, WITH NASAL SEPTOPLASTY, TURBINOPLASTY, AND MAXILLARY SINUSOTOMY;  Surgeon: Evelina Hippo, MD;  Location: [facility];  Service:  ENT;  Laterality: N/A;   TURBINATE REDUCTION Bilateral 11/07/2023   Procedure: REDUCTION, NASAL TURBINATE;  Surgeon: Evelina Hippo, MD;  Location: [facility];  Service: ENT;  Laterality: Bilateral;    Family History  Problem Relation Age of Onset   Hypertension Mother    Diabetes Mother    Cancer Father    Colon cancer Maternal Aunt    Stroke Paternal Uncle    Diabetes Maternal Grandmother    Hyperlipidemia Maternal Grandmother    Hypertension Maternal Grandmother    Breast cancer Maternal Grandmother    Hypertension Maternal Grandfather    Heart disease Maternal Grandfather    Skin cancer Maternal Grandfather    Heart attack Paternal Grandmother    Stroke Paternal Grandfather    Allergic rhinitis Neg Hx    Asthma Neg Hx    Eczema Neg Hx    Urticaria Neg Hx      Social Connections: Moderately Isolated (12/14/2023)   Social Connection and Isolation Panel [NHANES]    Frequency of Communication with Friends and Family: Twice a week    Frequency of Social Gatherings with Friends and Family: Once a week    Attends Religious Services: Never    Database administrator or Organizations: No    Attends Engineer, structural: Not on file    Marital Status: Married      Current Outpatient Medications:    AMBULATORY NON FORMULARY MEDICATION, Continuous positive airway pressure (CPAP) machine set on AutoPAP (10-20 cmH2O), with all supplemental supplies as needed. Please provide a large size [employer] Full Face Simplus Mask., Disp: 1 each, Rfl: 0   calcium-vitamin D  (OSCAL WITH D) 500-5 MG-MCG tablet, Take 1 tablet by mouth., Disp: , Rfl:    cyanocobalamin (VITAMIN B12) 500 MCG tablet,  Take 500 mcg by mouth daily., Disp: , Rfl:    MAGNESIUM HYDROXIDE PO, Take by mouth., Disp: , Rfl:    Menaquinone-7 (K2 PO), Take by mouth., Disp: , Rfl:    tadalafil  (CIALIS ) 20 MG tablet, Take 1 tablet (20 mg total) by mouth daily., Disp: 20 tablet, Rfl: 5   Zinc Acetate, Oral,  (ZINC ACETATE PO), Take by mouth., Disp: , Rfl:    sodium chloride  (OCEAN) 0.65 % SOLN nasal spray, Place 2 sprays into both nostrils as needed for up to 14 days for congestion., Disp: 30 mL, Rfl: 0   Physical Exam:   BP 124/71 (BP Location: Right Arm, Patient Position: Sitting, Cuff Size: Large)   Pulse (!) 53   Ht 5\' 9"  (1.753 m)   Wt (!) 305 lb (138.3 kg)   SpO2 98%   BMI 45.04 kg/m   Salient findings:  CN II-XII intact Anterior rhinoscopy: Septum more midline and bilateral IT reduced, crusting improved; Nasal endoscopy was indicated to better evaluate the nose and paranasal sinuses, given the patient's history and exam findings, and is detailed below. No lesions of oral cavity/oropharynx; dentition good No respiratory distress or stridor  Seprately Identifiable Procedures:  PROCEDURE: Bilateral Diagnostic Rigid Nasal Endoscopy with Bilateral Debridement Pre-procedure diagnosis: Left nasal obstruction, Post-operative examination and care after bilateral Functional Endoscopic Sinus Surgery - of note: this procedure was NOT performed for management of the septoplasty or the turbinate reduction. Post-procedure diagnosis: same Indication: See pre-procedure diagnosis and physical exam above Complications: None apparent EBL: <5 mL Anesthesia: Lidocaine  4% and topical decongestant was topically sprayed in each nasal cavity  Description of Procedure:  Patient was identified. A rigid 30 degree endoscope was utilized to evaluate the sinonasal cavities, mucosa, sinus ostia and turbinates and septum.  Overall, signs of mucosal inflammation are noted but improved. Also noted are bilateral post-surgical changes with some crusting and clot within bilateral middle meati - right worse than left today, which was debrided. There is some polypoid edema in bilateral maxillary sinuses which is improved. Ethmoid cavity widely patent. These were debrided with a 8 Fr suction. After debridement, sinus cavity  patency was much improved. No adverse synechiae are noted. Slight recurvature posterior septum right Right Middle meatus: improved patency after debridement Right SE Recess: patent Left MM: improved patency after debridement Left SE Recess: patent  CPT CODE -- 21308 - Mod 79, 50      Impression & Plans:  Kyle Duncan is a 48 y.o. male with:  1. Nasal obstruction   2. Nasal congestion   3. Nasal septal deviation   4. Other chronic sinusitis   5. Hypertrophy of both inferior nasal turbinates   6. S/P FESS (functional endoscopic sinus surgery)    Now s/p septo/turbs and bilateral FESS 10/2023. He is doing well currently and sinuses healing appropriately. Debrided and cavities looking much improved.  Continue ryaltris  665-25 mcg two sprays each nostril twice per day and zyrtec PO daily Continue daily NeilMed Sinus rinses - add 6 drops pred forte  to each rinse Appreciate Dr. Marlis Simper continued help in managing the allergic component - f/u early fall 2025  Thank you for allowing me the opportunity to care for your patient. Please do not hesitate to contact me should you have any other questions.  Sincerely, Milon Aloe, MD Otolaryngologist (ENT), [facility] Phone: [phone] Fax: [phone]  01/04/2024, 10:37 AM   MDM:  [phone] - post op Complexity/Problems addressed: mod - multiple chronic problems Data complexity:  low - Morbidity: low - Prescription Drug prescribed or managed: no

## 2024-01-10 ENCOUNTER — Ambulatory Visit: Payer: Self-pay | Admitting: Medical-Surgical

## 2024-01-26 ENCOUNTER — Ambulatory Visit: Payer: Managed Care, Other (non HMO) | Admitting: Allergy

## 2024-02-01 ENCOUNTER — Telehealth: Payer: Self-pay

## 2024-02-01 NOTE — Telephone Encounter (Signed)
 Yes, I will send them.

## 2024-02-01 NOTE — Telephone Encounter (Signed)
 Received notice of a denial for MRA for this patient - reason states that they did not receive any records with the PA request.  This can be viewed in Media tab.  Can the records be sent and PA reconsidered?

## 2024-02-02 ENCOUNTER — Encounter: Payer: Self-pay | Admitting: Allergy

## 2024-02-02 ENCOUNTER — Ambulatory Visit: Admitting: Allergy

## 2024-02-02 VITALS — BP 128/66 | HR 62 | Temp 97.8°F | Wt 326.2 lb

## 2024-02-02 DIAGNOSIS — J301 Allergic rhinitis due to pollen: Secondary | ICD-10-CM

## 2024-02-02 DIAGNOSIS — B999 Unspecified infectious disease: Secondary | ICD-10-CM | POA: Diagnosis not present

## 2024-02-02 DIAGNOSIS — J3089 Other allergic rhinitis: Secondary | ICD-10-CM

## 2024-02-02 DIAGNOSIS — K219 Gastro-esophageal reflux disease without esophagitis: Secondary | ICD-10-CM | POA: Diagnosis not present

## 2024-02-02 NOTE — Progress Notes (Signed)
 Follow Up Note  RE: Kyle Duncan MRN: [id number] DOB: May 24, 1976 Date of Office Visit: 02/02/2024  Referring provider: Cherre Cornish, NP Primary care provider: Cherre Cornish, NP  Chief Complaint: Follow-up  History of Present Illness: I had the pleasure of seeing Kyle Duncan for a follow up visit at the [facility] on 02/02/2024. He is a 48 y.o. male, who is being followed for allergic rhinitis, recurrent infections, GERD. His previous allergy  office visit was on 09/29/2023 with Dr. Burdette Carolin. Today is a regular follow up visit.  Currently asymptomatic.  Using saline rinse with good benefit.  Had sinus surgery in March and had turbinate reduction and septum deviation surgery.  Takes otc antihistamines. Ryatlris 1 spray per nostril once a day.  Assessment and Plan: Kyle Duncan is a 48 y.o. male with: Allergic rhinitis due to dust mite Seasonal allergic rhinitis due to pollen Past history - 2 dogs and 1 rabbit at home. 2024 skin testing positive to grass, ragweed, weed, trees, dust mites.  Interim history - doing much better post sinus surgery.  Continue environmental control measures as below. Use over the counter antihistamines such as Zyrtec (cetirizine), Claritin (loratadine), Allegra  (fexofenadine ), or Xyzal  (levocetirizine) daily as needed. May take twice a day during allergy  flares. May switch antihistamines every few months. May use Singulair  (montelukast ) 10mg  daily at night. Continue Ryaltris  (olopatadine + mometasone nasal spray combination) 1-2 sprays per nostril twice a day.  Nasal saline spray (i.e., Simply Saline) or nasal saline lavage (i.e., NeilMed) is recommended as needed and prior to medicated nasal sprays. Consider allergy  injections for long term control if above medications do not help the symptoms.   Recurrent infections Past history - frequent sinus infections requiring antibiotics and steroids. 2024 labs normal immunoglobulin levels, poor  pneumococcal titers.  Interim history - no infections. Didn't get pneumonia shot and prefers to wait and see how he does over the winter.  Keep track of infections and antibiotics use.   Gastroesophageal reflux disease, unspecified whether esophagitis present Continue lifestyle and dietary modifications. May take famotidine 20-40mg  daily as needed.  Return in about 1 year (around 02/01/2025).  No orders of the defined types were placed in this encounter.  Lab Orders  No laboratory test(s) ordered today    Diagnostics: None.   Medication List:  Current Outpatient Medications  Medication Sig Dispense Refill   AMBULATORY NON FORMULARY MEDICATION Continuous positive airway pressure (CPAP) machine set on AutoPAP (10-20 cmH2O), with all supplemental supplies as needed. Please provide a large size [employer] Full Face Simplus Mask. 1 each 0   calcium-vitamin D  (OSCAL WITH D) 500-5 MG-MCG tablet Take 1 tablet by mouth.     cyanocobalamin (VITAMIN B12) 500 MCG tablet Take 500 mcg by mouth daily.     MAGNESIUM HYDROXIDE PO Take by mouth.     Menaquinone-7 (K2 PO) Take by mouth.     RYALTRIS  665-25 MCG/ACT SUSP SMARTSIG:1-2 Spray(s) Both Nares Morning-Night     tadalafil  (CIALIS ) 20 MG tablet Take 1 tablet (20 mg total) by mouth daily. 20 tablet 5   Zinc Acetate, Oral, (ZINC ACETATE PO) Take by mouth.     anastrozole (ARIMIDEX) 1 MG tablet Take 1 mg by mouth once a week. (Patient not taking: Reported on 02/02/2024)     sodium chloride  (OCEAN) 0.65 % SOLN nasal spray Place 2 sprays into both nostrils as needed for up to 14 days for congestion. (Patient not taking: Reported on 02/02/2024) 30 mL 0  No current facility-administered medications for this visit.   Allergies: Allergies  Allergen Reactions   Dust Mite Mixed Allergen Ext [Mite (D. Farinae)]     Dust   I reviewed his past medical history, social history, family history, and environmental history and no significant changes have  been reported from his previous visit.  Review of Systems  Constitutional:  Negative for appetite change, chills, fever and unexpected weight change.  HENT:  Negative for congestion.   Eyes:  Negative for itching.  Respiratory:  Negative for cough, chest tightness, shortness of breath and wheezing.   Cardiovascular:  Negative for chest pain.  Gastrointestinal:  Negative for abdominal pain.  Genitourinary:  Negative for difficulty urinating.  Skin:  Negative for rash.  Allergic/Immunologic: Positive for environmental allergies.  Neurological:  Negative for headaches.    Objective: BP 128/66   Pulse 62   Temp 97.8 F (36.6 C)   Wt (!) 326 lb 4 oz (148 kg)   SpO2 95%   BMI 48.18 kg/m  Body mass index is 48.18 kg/m. Physical Exam Vitals and nursing note reviewed.  Constitutional:      Appearance: Normal appearance. He is well-developed.  HENT:     Head: Normocephalic and atraumatic.     Right Ear: Tympanic membrane and external ear normal.     Left Ear: Tympanic membrane and external ear normal.     Nose: Nose normal.     Mouth/Throat:     Mouth: Mucous membranes are moist.     Pharynx: Oropharynx is clear.   Eyes:     Conjunctiva/sclera: Conjunctivae normal.    Cardiovascular:     Rate and Rhythm: Normal rate and regular rhythm.     Heart sounds: Normal heart sounds. No murmur heard.    No friction rub. No gallop.  Pulmonary:     Effort: Pulmonary effort is normal.     Breath sounds: Normal breath sounds. No wheezing, rhonchi or rales.   Musculoskeletal:     Cervical back: Neck supple.   Skin:    General: Skin is warm.     Findings: No rash.   Neurological:     Mental Status: He is alert and oriented to person, place, and time.   Psychiatric:        Behavior: Behavior normal.   Previous notes and tests were reviewed. The plan was reviewed with the patient/family, and all questions/concerned were addressed.  It was my pleasure to see Kyle Duncan today and  participate in his care. Please feel free to contact me with any questions or concerns.  Sincerely,  Eudelia Hero, DO Allergy  & Immunology  [facility]  [place] office: [phone] [facility] office: [phone]

## 2024-02-02 NOTE — Patient Instructions (Addendum)
 Environmental allergies 2024 skin testing positive to grass, ragweed, weed, trees, dust mites.  Continue environmental control measures as below. Use over the counter antihistamines such as Zyrtec (cetirizine), Claritin (loratadine), Allegra  (fexofenadine ), or Xyzal  (levocetirizine) daily as needed. May take twice a day during allergy  flares. May switch antihistamines every few months. May use Singulair  (montelukast ) 10mg  daily at night. Continue Ryaltris  (olopatadine + mometasone nasal spray combination) 1-2 sprays per nostril twice a day.  Nasal saline spray (i.e., Simply Saline) or nasal saline lavage (i.e., NeilMed) is recommended as needed and prior to medicated nasal sprays. Consider allergy  injections for long term control if above medications do not help the symptoms.  Heartburn: Continue lifestyle and dietary modifications. May take famotidine 20-40mg  daily as needed.   Infections Keep track of infections and antibiotics use.  Follow up in 12 months or sooner if needed.    Reducing Pollen Exposure Pollen seasons: trees (spring), grass (summer) and ragweed/weeds (fall). Keep windows closed in your home and car to lower pollen exposure.  Install air conditioning in the bedroom and throughout the house if possible.  Avoid going out in dry windy days - especially early morning. Pollen counts are highest between 5 - 10 AM and on dry, hot and windy days.  Save outside activities for late afternoon or after a heavy rain, when pollen levels are lower.  Avoid mowing of grass if you have grass pollen allergy . Be aware that pollen can also be transported indoors on people and pets.  Dry your clothes in an automatic dryer rather than hanging them outside where they might collect pollen.  Rinse hair and eyes before bedtime.  Control of House Dust Mite Allergen Dust mite allergens are a common trigger of allergy  and asthma symptoms. While they can be found throughout the house, these  microscopic creatures thrive in warm, humid environments such as bedding, upholstered furniture and carpeting. Because so much time is spent in the bedroom, it is essential to reduce mite levels there.  Encase pillows, mattresses, and box springs in special allergen-proof fabric covers or airtight, zippered plastic covers.  Bedding should be washed weekly in hot water (130 F) and dried in a hot dryer. Allergen-proof covers are available for comforters and pillows that can't be regularly washed.  Wash the allergy -proof covers every few months. Minimize clutter in the bedroom. Keep pets out of the bedroom.  Keep humidity less than 50% by using a dehumidifier or air conditioning. You can buy a humidity measuring device called a hygrometer to monitor this.  If possible, replace carpets with hardwood, linoleum, or washable area rugs. If that's not possible, vacuum frequently with a vacuum that has a HEPA filter. Remove all upholstered furniture and non-washable window drapes from the bedroom. Remove all non-washable stuffed toys from the bedroom.  Wash stuffed toys weekly.

## 2024-04-24 ENCOUNTER — Encounter: Payer: Self-pay | Admitting: Sports Medicine

## 2024-05-08 ENCOUNTER — Ambulatory Visit (INDEPENDENT_AMBULATORY_CARE_PROVIDER_SITE_OTHER): Admitting: Otolaryngology

## 2024-06-11 ENCOUNTER — Encounter (INDEPENDENT_AMBULATORY_CARE_PROVIDER_SITE_OTHER): Payer: Self-pay | Admitting: Otolaryngology

## 2024-06-11 ENCOUNTER — Ambulatory Visit (INDEPENDENT_AMBULATORY_CARE_PROVIDER_SITE_OTHER): Admitting: Otolaryngology

## 2024-06-11 VITALS — BP 143/75 | HR 52 | Ht 69.0 in | Wt 326.0 lb

## 2024-06-11 DIAGNOSIS — J343 Hypertrophy of nasal turbinates: Secondary | ICD-10-CM

## 2024-06-11 DIAGNOSIS — J342 Deviated nasal septum: Secondary | ICD-10-CM

## 2024-06-11 DIAGNOSIS — J3489 Other specified disorders of nose and nasal sinuses: Secondary | ICD-10-CM

## 2024-06-11 DIAGNOSIS — Z9889 Other specified postprocedural states: Secondary | ICD-10-CM

## 2024-06-11 DIAGNOSIS — R0981 Nasal congestion: Secondary | ICD-10-CM

## 2024-06-11 DIAGNOSIS — J328 Other chronic sinusitis: Secondary | ICD-10-CM

## 2024-06-11 NOTE — Patient Instructions (Signed)
 Use flonase  4 sprays in the rinse bottle every time you rinse -- after Thanksigiving

## 2024-06-23 NOTE — Progress Notes (Signed)
 Dear Dr. Willo, Here is my assessment for our mutual patient, Kyle Duncan. Thank you for allowing me the opportunity to care for your patient. Please do not hesitate to contact me should you have any other questions. Sincerely, Dr. Eldora Blanch  Otolaryngology Clinic Note Referring provider: Dr. Willo HPI:  Kyle Duncan is a 48 y.o. male kindly referred by Dr. Willo for evaluation of chronic sinusitis and allergic rhinitis  Initial visit (07/2023): Patient reports: he reports that he has has had sinus issues for several years and allergies. He saw Dr. Luke in 06/28/2023 for allergies. He reports that every couple of months, he is getting some kind of sinus infection and cold. He starts to have PND, maxillary pressure, nasal stuffiness/congestion and some pressure behind the eyes. It turns into a sinus infection -- then he will have headache, significant congestion, mostly mucoid drainage but intermittent discoloration, maxillary/frontal and ethmoid pressure (mostly max and ethmoid). Sense of smell is not great. He normally does get antibiotics for it (5-6 courses per year). Will also get steroids last 2-3 visits and both antibiotics and steroids help significantly. Allergy  symptoms are all the time - baseline congested - both sides. Has had allergy  testing (2024) - grass, ragweed, weed, trees, dust mites No frequent PNA or ear infections  Of note, he does think he has a deviated septum.  He is using Ryaltris , Zyrtec. He does think it helps some. He has not used saline rinses. Sudafed does not work, mucinex does not work. Has not used singulair . Advil  cold/sinus works. He has not used afrin.  Does take supplements  No CT sinus.   Has GERD, pepcid. --------------------------------------------------------- He was treated medically maximally and now returns after post-treatment CT.  09/21/2023 He reports that he is doing ok from sinus standpoint; no discolored drainage, feels like  things have cleared some. No significant pressure. 5-6 infections/year overall still. He is using ryaltris . Antibiotics and steroids helped. Complains of left nasal obstruction. We did discuss his CT. --------------------------------------------------------- 11/11/2023 Seen in POV after septo/turbs and bilateral FESS. Splints removed, some bleeding but reports after removal that he is breathing much better. Finished abx/steroids. --------------------------------------------------------- 11/18/2023 He was scheduled for a 1 month f/u but seen earlier given he was having oral cavity cavity over the uvula due to ETT rubbing. He reports that he got abx/steroids for this and he is doing better from that standpoint. Today, he does report that his left nasal cavity feels quite stuffed up and he cannot breathe out of it. No pressure in the face --------------------------------------------------------- 11/25/2023 Doing some better, still with some left sided foreign body sensation/crusting. Otherwise doing ok without any pressure in face. He is rinsing twice and using ryaltris . --------------------------------------------------------- 01/04/2024 Doing well now. Breathing improved from nose, no facial pressure, no sinus infections. Rinsing daily and using ryaltris . At end of day sometimes has to rinse again because he feels congested. Otherwise pleased with the result  --------------------------------------------------------- 06/11/2024 Doing well, breathing well, no sinus infections since last visit. Rinsing daily, using spray.    PMHx: Allergies, GERD, PSVT (history), OSA ON CPAP  H&N Surgery: no Personal or FHx of bleeding dz or anesthesia difficulty: no  GLP-1: no AP/AC: no  Tobacco: prior smoker (quit 8 years ago). Alcohol: no. Occupation: Building Control Surveyor. Lives in [place], [place]  Independent Review of Additional Tests or Records:  Labs (07/01/2023):  Eos 0 IgG, A, M:  negative; Complement: normal Strep pneumo titers: low Skin testing 03/29/2023 independently reviewed: +  grass, ragweed, weed, trees, dust mites Dr. Mariella office notes (Allergy ) - 06/28/2023: constant sinus infection. Ryaltris , zyrtec; freq sinus infxn req abx/steroids. Did not try netipot or singulair ; uses CPAP. Skin testing + to grass, ragweed, weed, trees, dust mites. Rec; Start singulair , ryaltris , allergy  shots and ref to ENT; rec immune workup PCP note Dr. Joshua 05/23/2023: No significant changes, continuing CPAP ED 06/24/2023: nasal drainage, watery eyes; Dx sinusitis; Rx augmentin  and steroids and PO antihistamine ED 03/02/2023: Congestion, URI; Rx flonase   MRI Brain 09/11/22 rev independently: chronic b/l L > R sinusitis with sinuous septum; most prominent disease along bilateral max, but some ethmoid, sphenoid and frontal MPT as well  CT Sinus 08/23/2023 independently reviewed and interpreted: left caudal septal deviation, right posterior; bilateral inferior turbinate hypertrophy; trace thickening left frontal and mild b/l ethmoids; bilateral maxillary sinus opacification along floors with cyss, narrow OMC; maxillary disease has not resolved as compared to MRI  Path 11/07/2023: chronic inflammation, no malignancy   PMH/Meds/All/SocHx/FamHx/ROS:   Past Medical History:  Diagnosis Date   H/O: vasectomy 06/09/2016   01/2013   PSVT (paroxysmal supraventricular tachycardia)    Recurrent upper respiratory infection (URI)    Sleep apnea      Past Surgical History:  Procedure Laterality Date   CARDIAC CATHETERIZATION     [facility]   COLONOSCOPY     SEPTOPLASTY WITH ETHMOIDECTOMY, AND MAXILLARY ANTROSTOMY Bilateral 11/07/2023   Procedure: FESS, WITH MAXILLARY ANTROSTOMY, SEPTOPLASTY, AND ETHMOIDECTOMY;  Surgeon: Tobie Eldora NOVAK, MD;  Location: [facility];  Service: ENT;  Laterality: Bilateral;   SINUS ENDO WITH FUSION N/A 11/07/2023   Procedure: SURGERY, PARANASAL  SINUS, ENDOSCOPIC, WITH NASAL SEPTOPLASTY, TURBINOPLASTY, AND MAXILLARY SINUSOTOMY;  Surgeon: Tobie Eldora NOVAK, MD;  Location: [facility];  Service: ENT;  Laterality: N/A;   TURBINATE REDUCTION Bilateral 11/07/2023   Procedure: REDUCTION, NASAL TURBINATE;  Surgeon: Tobie Eldora NOVAK, MD;  Location: [facility];  Service: ENT;  Laterality: Bilateral;    Family History  Problem Relation Age of Onset   Hypertension Mother    Diabetes Mother    Cancer Father    Colon cancer Maternal Aunt    Stroke Paternal Uncle    Diabetes Maternal Grandmother    Hyperlipidemia Maternal Grandmother    Hypertension Maternal Grandmother    Breast cancer Maternal Grandmother    Hypertension Maternal Grandfather    Heart disease Maternal Grandfather    Skin cancer Maternal Grandfather    Heart attack Paternal Grandmother    Stroke Paternal Grandfather    Allergic rhinitis Neg Hx    Asthma Neg Hx    Eczema Neg Hx    Urticaria Neg Hx      Social Connections: Moderately Isolated (12/14/2023)   Social Connection and Isolation Panel    Frequency of Communication with Friends and Family: Twice a week    Frequency of Social Gatherings with Friends and Family: Once a week    Attends Religious Services: Never    Database Administrator or Organizations: No    Attends Engineer, Structural: Not on file    Marital Status: Married      Current Outpatient Medications:    AMBULATORY NON FORMULARY MEDICATION, Continuous positive airway pressure (CPAP) machine set on AutoPAP (10-20 cmH2O), with all supplemental supplies as needed. Please provide a large size [employer] Full Face Simplus Mask., Disp: 1 each, Rfl: 0   calcium-vitamin D  (OSCAL WITH D) 500-5 MG-MCG tablet, Take  1 tablet by mouth., Disp: , Rfl:    cyanocobalamin (VITAMIN B12) 500 MCG tablet, Take 500 mcg by mouth daily., Disp: , Rfl:    MAGNESIUM HYDROXIDE PO, Take by mouth., Disp: , Rfl:    Menaquinone-7 (K2 PO),  Take by mouth., Disp: , Rfl:    Na Sulfate-K Sulfate-Mg Sulfate concentrate (SUPREP) 17.5-3.13-1.6 GM/177ML SOLN, Take 177 mLs by mouth., Disp: , Rfl:    RYALTRIS  665-25 MCG/ACT SUSP, SMARTSIG:1-2 Spray(s) Both Nares Morning-Night, Disp: , Rfl:    tadalafil  (CIALIS ) 20 MG tablet, Take 1 tablet (20 mg total) by mouth daily., Disp: 20 tablet, Rfl: 5   Zinc Acetate, Oral, (ZINC ACETATE PO), Take by mouth., Disp: , Rfl:    anastrozole (ARIMIDEX) 1 MG tablet, Take 1 mg by mouth once a week. (Patient not taking: Reported on 06/11/2024), Disp: , Rfl:    Physical Exam:   BP (!) 143/75 (BP Location: Left Arm, Patient Position: Sitting, Cuff Size: Large)   Pulse (!) 52   Ht 5' 9 (1.753 m)   Wt (!) 326 lb (147.9 kg)   SpO2 96%   BMI 48.14 kg/m   Salient findings:  CN II-XII intact Anterior rhinoscopy: Septum more midline and bilateral IT reduced; Nasal endoscopy was indicated to better evaluate the nose and paranasal sinuses, given the patient's history and exam findings, and is detailed below. No respiratory distress or stridor  Seprately Identifiable Procedures:  PROCEDURE: Bilateral Diagnostic Rigid Nasal Endoscopy with Bilateral Debridement Pre-procedure diagnosis: Left nasal obstruction, Post-operative examination and care after bilateral Functional Endoscopic Sinus Surgery Post-procedure diagnosis: same Indication: See pre-procedure diagnosis and physical exam above Complications: None apparent EBL: <5 mL Anesthesia: Lidocaine  4% and topical decongestant was topically sprayed in each nasal cavity  Description of Procedure:  Patient was identified. A rigid 30 degree endoscope was utilized to evaluate the sinonasal cavities, mucosa, sinus ostia and turbinates and septum.  Overall, signs of mucosal inflammation are noted. Also noted are bilateral post-surgical changes with some continued crusting over bilateral middle meati - obstructive, which were debrided to restore patency. Max polypoid  edema now resolved. Ethmoid cavity widely patent. These were debrided with a 8 Fr suction. After debridement, sinus cavity patency was much improved. No adverse synechiae are noted. Slight recurvature posterior septum right Right Middle meatus: improved patency after debridement Right SE Recess: patent Left MM: improved patency after debridement Left SE Recess: patent  CPT CODE -- 68762 - Mod 50      Impression & Plans:  Zekiel Torian is a 48 y.o. male with:  1. Nasal obstruction   2. Nasal congestion   3. Nasal septal deviation   4. Other chronic sinusitis   5. Hypertrophy of both inferior nasal turbinates   6. S/P FESS (functional endoscopic sinus surgery)    Now s/p septo/turbs and bilateral FESS 10/2023. He is doing well currently. Debrided and cavities looking much improved.  Continue daily NeilMed Sinus rinses - can stop pred forte , and just do flonase  Appreciate Dr. Mariella continued help in managing the allergic component - f/u 6 months  Thank you for allowing me the opportunity to care for your patient. Please do not hesitate to contact me should you have any other questions.  Sincerely, Eldora Blanch, MD Otolaryngologist (ENT), [facility] Phone: [phone] Fax: [phone]  06/23/2024, 5:22 PM   MDM:  [id number] Complexity/Problems addressed: low Data complexity: low - Morbidity: mod - Prescription Drug prescribed or managed: y

## 2024-08-01 ENCOUNTER — Other Ambulatory Visit: Payer: Self-pay

## 2024-08-01 ENCOUNTER — Ambulatory Visit
Admission: EM | Admit: 2024-08-01 | Discharge: 2024-08-01 | Disposition: A | Discharge status: Home / Self Care | Attending: Family Medicine | Admitting: Family Medicine

## 2024-08-01 DIAGNOSIS — M5441 Lumbago with sciatica, right side: Secondary | ICD-10-CM

## 2024-08-01 MED ORDER — TIZANIDINE HCL 4 MG PO TABS: 4.0000 mg | ORAL_TABLET | Freq: Four times a day (QID) | ORAL | 0 refills | Status: AC | PRN

## 2024-08-01 MED ORDER — METHYLPREDNISOLONE 4 MG PO TBPK: ORAL_TABLET | ORAL | 0 refills | Status: AC

## 2024-08-01 NOTE — ED Triage Notes (Signed)
 Pt presenting with c/o right leg pain radiating to his toes x 2 weeks. Pt stated that he has sciatica and the pain is only relieved when he is in the fetal position. Pt stated that he presently has on a Lidocaine  patch and  has been using Tylenol  and Ibuprofen  which are all ineffective.

## 2024-08-01 NOTE — Discharge Instructions (Signed)
 Activity as tolerated. May use ice or heat to area Take the steroid as directed.  Take all of day 1 today Take tizanidine as needed as muscle relaxer.  This is useful at bedtime See your chiropractor next week

## 2024-08-01 NOTE — ED Provider Notes (Signed)
 [facility]    CSN: [id number] Arrival date & time: 08/01/24  9048      History   Chief Complaint Chief Complaint  Patient presents with   Leg Pain    HPI Kyle Duncan is a 48 y.o. male.   Patient known to me from prior visit.  Is here today with leg pain.  States has been going on for a couple of weeks.  He has tried Tylenol  and ibuprofen .  Rest and stretches.  He called his chiropractor but cannot get in until next week.  He states he has a history of back problems with occasional sciatica.  He has not had any fall or injury.  He does not have any numbness, weakness.  No bowel or bladder complaints.  Pain is from the low back and buttock region down the outside of the leg to the toes.  Some tingling in the toes that comes and goes.    Past Medical History:  Diagnosis Date   H/O: vasectomy 06/09/2016   01/2013   PSVT (paroxysmal supraventricular tachycardia)    Recurrent upper respiratory infection (URI)    Sleep apnea     Patient Active Problem List   Diagnosis Date Noted   Nasal septal deviation 11/07/2023   Nasal obstruction 11/07/2023   Recurrent maxillary sinusitis 11/07/2023   Recurrent ethmoidal sinusitis 11/07/2023   Hypertrophy of both inferior nasal turbinates 11/07/2023   BMI 40.0-44.9, adult (HCC) 06/10/2023   Chronic pain of left ankle 09/01/2021   Right lumbar radiculopathy 05/05/2021   Biceps tendinitis, left, distal 05/05/2021   De Quervain's tenosynovitis, right 12/01/2020   Erectile dysfunction 12/01/2020   Cubital tunnel syndrome of both upper extremities 09/07/2017   Tennis elbow 09/07/2017   H/O: vasectomy 06/09/2016   Decreased testosterone  level 06/03/2016   History of PSVT (paroxysmal supraventricular tachycardia) 05/31/2016   Bradycardia 05/31/2016   Fatigue 05/31/2016   Decreased libido 05/31/2016   Weight gain 05/31/2016    Past Surgical History:  Procedure Laterality Date   CARDIAC CAT[facility]IZ[facility]     [facility]   [facility]COPY     SEPTOPLASTY WITH ETHMOIDECTOMY, AND MAXILLARY ANTROSTOMY Bilateral 11/07/2023   Procedure: FESS, WITH MAXILLARY ANTROSTOMY, SEPTOPLASTY, AND ETHMOIDECTOMY;  Surgeon: Tobie Eldora NOVAK, MD;  Location: [facility];  Service: ENT;  Laterality: Bilateral;   SINUS ENDO WITH FUSION N/A 11/07/2023   Procedure: SURGERY, PARANASAL SINUS, ENDOSCOPIC, WITH NASAL SEPTOPLASTY, TURBINOPLASTY, AND MAXILLARY SINUSOTOMY;  Surgeon: Tobie Eldora NOVAK, MD;  Location: [facility];  Service: ENT;  Laterality: N/A;   TURBINATE REDUCTION Bilateral 11/07/2023   Procedure: REDUCTION, NASAL TURBINATE;  Surgeon: Tobie Eldora NOVAK, MD;  Location: [facility];  Service: ENT;  Laterality: Bilateral;       Home Medications    Prior to Admission medications   Medication Sig Start Date End Date Taking? Authorizing Provider  methylPREDNISolone  (MEDROL  DOSEPAK) 4 MG TBPK tablet tad 08/01/24  Yes Maranda Jamee Jacob, MD  tiZANidine (ZANAFLEX) 4 MG tablet Take 1-2 tablets (4-8 mg total) by mouth every 6 (six) hours as needed for muscle spasms. 08/01/24  Yes Maranda Jamee Jacob, MD  AMBULATORY NON FORMULARY MEDICATION Continuous positive airway pressure (CPAP) machine set on AutoPAP (10-20 cmH2O), with all supplemental supplies as needed. Please provide a large size [employer] Full Face Simplus Mask. 06/02/22   Willo Mini, NP  calcium-vitamin D  (OSCAL WITH D) 500-5 MG-MCG tablet Take 1 tablet by mouth.    [provider]  cyanocobalamin (VITAMIN B12) 500 MCG tablet Take 500 mcg by mouth daily.    [provider]  MAGNESIUM HYDROXIDE PO Take by mouth.    [provider]  Menaquinone-7 (K2 PO) Take by mouth.    [provider]  tadalafil  (CIALIS ) 20 MG tablet Take 1 tablet (20 mg total) by mouth daily. 12/14/23   Jessup, Joy, NP  Zinc Acetate, Oral, (ZINC ACETATE PO) Take by mouth.    [provider]    Family  History Family History  Problem Relation Age of Onset   Hypertension Mother    Diabetes Mother    Cancer Father    Colon cancer Maternal Aunt    Stroke Paternal Uncle    Diabetes Maternal Grandmother    Hyperlipidemia Maternal Grandmother    Hypertension Maternal Grandmother    Breast cancer Maternal Grandmother    Hypertension Maternal Grandfather    Heart disease Maternal Grandfather    Skin cancer Maternal Grandfather    Heart attack Paternal Grandmother    Stroke Paternal Grandfather    Allergic rhinitis Neg Hx    Asthma Neg Hx    Eczema Neg Hx    Urticaria Neg Hx     Social History Social History   Tobacco Use   Smoking status: Former    Types: Cigarettes    Passive exposure: Past   Smokeless tobacco: Never  Vaping Use   Vaping status: Never Used  Substance Use Topics   Alcohol use: Yes    Comment: socially   Drug use: Never     Allergies   Dust mite mixed allergen ext [mite (d. farinae)]   Review of Systems Review of Systems  See HPI Physical Exam Triage Vital Signs ED Triage Vitals  Encounter Vitals Group     BP 08/01/24 1014 133/81     Girls Systolic BP Percentile --      Girls Diastolic BP Percentile --      Boys Systolic BP Percentile --      Boys Diastolic BP Percentile --      Pulse Rate 08/01/24 1014 (!) 53     Resp 08/01/24 1014 18     Temp 08/01/24 1014 98.2 F (36.8 C)     Temp Source 08/01/24 1014 Oral     SpO2 08/01/24 1014 95 %     Weight 08/01/24 1017 300 lb (136.1 kg)     Height 08/01/24 1017 5' 10 (1.778 m)     Head Circumference --      Peak Flow --      Pain Score 08/01/24 1016 8     Pain Loc --      Pain Education --      Exclude from Growth Chart --    No data found.  Updated Vital Signs BP 133/81 (BP Location: Right Arm)   Pulse (!) 53   Temp 98.2 F (36.8 C) (Oral)   Resp 18   Ht 5' 10 (1.778 m)   Wt 136.1 kg   SpO2 95%   BMI 43.05 kg/m      Physical Exam Constitutional:      General: He is in acute  distress.     Appearance: He is well-developed.     Comments: Stiff posture.  Appears uncomfortable.  Slow movements.  HENT:     Head: Normocephalic and atraumatic.  Eyes:     Conjunctiva/sclera: Conjunctivae normal.     Pupils: Pupils are equal, round, and reactive to light.  Cardiovascular:     Rate and Rhythm: Normal rate.  Pulmonary:     Effort: Pulmonary effort is normal. No respiratory distress.  Musculoskeletal:        General: Normal range of motion.     Cervical back: Normal range of motion.     Comments: Mild tenderness to deep palpation in the lower SI, buttock region on the right.  Limited range of motion secondary to pain.  Straight leg raise is positive.  Skin:    General: Skin is warm and dry.  Neurological:     Mental Status: He is alert.      UC Treatments / Results  Labs (all labs ordered are listed, but only abnormal results are displayed) Labs Reviewed - No data to display  EKG   Radiology No results found.  Procedures Procedures (including critical care time)  Medications Ordered in UC Medications - No data to display  Initial Impression / Assessment and Plan / UC Course  I have reviewed the triage vital signs and the nursing notes.  Pertinent labs & imaging results that were available during my care of the patient were reviewed by me and considered in my medical decision making (see chart for details).     Final Clinical Impressions(s) / UC Diagnoses   Final diagnoses:  Acute right-sided low back pain with right-sided sciatica     Discharge Instructions      Activity as tolerated. May use ice or heat to area Take the steroid as directed.  Take all of day 1 today Take tizanidine as needed as muscle relaxer.  This is useful at bedtime See your chiropractor next week   ED Prescriptions     Medication Sig Dispense Auth. Provider   methylPREDNISolone  (MEDROL  DOSEPAK) 4 MG TBPK tablet tad 21 tablet Maranda Jamee Jacob, MD    tiZANidine (ZANAFLEX) 4 MG tablet Take 1-2 tablets (4-8 mg total) by mouth every 6 (six) hours as needed for muscle spasms. 21 tablet Maranda Jamee Jacob, MD      PDMP not reviewed this encounter.   Maranda Jamee Jacob, MD 08/01/24 1150

## 2024-12-10 ENCOUNTER — Ambulatory Visit (INDEPENDENT_AMBULATORY_CARE_PROVIDER_SITE_OTHER): Admitting: Otolaryngology

## 2025-01-31 ENCOUNTER — Ambulatory Visit: Admitting: Allergy
